# Patient Record
Sex: Female | Born: 1955
Health system: Southern US, Community
[De-identification: ages and names within clinical notes are randomized; demographics above are authoritative.]

## PROBLEM LIST (undated history)

## (undated) DIAGNOSIS — M810 Age-related osteoporosis without current pathological fracture: Secondary | ICD-10-CM

## (undated) DIAGNOSIS — G43909 Migraine, unspecified, not intractable, without status migrainosus: Secondary | ICD-10-CM

## (undated) DIAGNOSIS — K5909 Other constipation: Secondary | ICD-10-CM

## (undated) DIAGNOSIS — G43019 Migraine without aura, intractable, without status migrainosus: Secondary | ICD-10-CM

## (undated) HISTORY — PX: FRACTURE SURGERY: SHX138

## (undated) HISTORY — DX: Migraine without aura, intractable, without status migrainosus: G43.019

---

## 2010-01-15 ENCOUNTER — Emergency Department (HOSPITAL_COMMUNITY): Admission: EM | Admit: 2010-01-15 | Discharge: 2010-01-15 | Payer: Self-pay | Admitting: Emergency Medicine

## 2010-07-20 ENCOUNTER — Emergency Department (HOSPITAL_COMMUNITY)
Admission: EM | Admit: 2010-07-20 | Discharge: 2010-07-20 | Disposition: A | Discharge status: Home / Self Care | Payer: Medicare HMO | Attending: Emergency Medicine | Admitting: Emergency Medicine

## 2010-07-20 ENCOUNTER — Emergency Department (HOSPITAL_COMMUNITY): Payer: Medicare HMO

## 2010-07-20 DIAGNOSIS — S52123A Displaced fracture of head of unspecified radius, initial encounter for closed fracture: Secondary | ICD-10-CM | POA: Insufficient documentation

## 2010-07-20 DIAGNOSIS — Z7982 Long term (current) use of aspirin: Secondary | ICD-10-CM | POA: Insufficient documentation

## 2010-07-20 DIAGNOSIS — M79609 Pain in unspecified limb: Secondary | ICD-10-CM | POA: Insufficient documentation

## 2010-07-20 DIAGNOSIS — S59909A Unspecified injury of unspecified elbow, initial encounter: Secondary | ICD-10-CM | POA: Insufficient documentation

## 2010-07-20 DIAGNOSIS — S6990XA Unspecified injury of unspecified wrist, hand and finger(s), initial encounter: Secondary | ICD-10-CM | POA: Insufficient documentation

## 2010-07-20 DIAGNOSIS — IMO0002 Reserved for concepts with insufficient information to code with codable children: Secondary | ICD-10-CM | POA: Insufficient documentation

## 2010-07-20 DIAGNOSIS — Z79899 Other long term (current) drug therapy: Secondary | ICD-10-CM | POA: Insufficient documentation

## 2010-07-20 DIAGNOSIS — M25529 Pain in unspecified elbow: Secondary | ICD-10-CM | POA: Insufficient documentation

## 2010-07-20 DIAGNOSIS — I10 Essential (primary) hypertension: Secondary | ICD-10-CM | POA: Insufficient documentation

## 2010-09-07 ENCOUNTER — Other Ambulatory Visit (HOSPITAL_COMMUNITY)
Admission: RE | Admit: 2010-09-07 | Discharge: 2010-09-07 | Disposition: A | Discharge status: Home / Self Care | Payer: Medicare HMO | Source: Ambulatory Visit | Attending: Internal Medicine | Admitting: Internal Medicine

## 2010-09-07 ENCOUNTER — Other Ambulatory Visit: Payer: Self-pay

## 2010-09-07 DIAGNOSIS — Z01419 Encounter for gynecological examination (general) (routine) without abnormal findings: Secondary | ICD-10-CM | POA: Insufficient documentation

## 2010-09-10 ENCOUNTER — Other Ambulatory Visit: Payer: Self-pay | Admitting: Family Medicine

## 2010-09-10 DIAGNOSIS — Z1231 Encounter for screening mammogram for malignant neoplasm of breast: Secondary | ICD-10-CM

## 2010-09-13 ENCOUNTER — Other Ambulatory Visit: Payer: Self-pay | Admitting: Family Medicine

## 2010-09-13 DIAGNOSIS — M810 Age-related osteoporosis without current pathological fracture: Secondary | ICD-10-CM

## 2010-10-16 ENCOUNTER — Ambulatory Visit
Admission: RE | Admit: 2010-10-16 | Discharge: 2010-10-16 | Disposition: A | Discharge status: Home / Self Care | Payer: Medicare HMO | Source: Ambulatory Visit | Attending: Family Medicine | Admitting: Family Medicine

## 2010-10-16 DIAGNOSIS — Z1231 Encounter for screening mammogram for malignant neoplasm of breast: Secondary | ICD-10-CM

## 2010-10-16 DIAGNOSIS — M810 Age-related osteoporosis without current pathological fracture: Secondary | ICD-10-CM

## 2011-11-19 ENCOUNTER — Other Ambulatory Visit: Payer: Self-pay | Admitting: Family Medicine

## 2011-11-19 ENCOUNTER — Other Ambulatory Visit (HOSPITAL_COMMUNITY)
Admission: RE | Admit: 2011-11-19 | Discharge: 2011-11-19 | Disposition: A | Discharge status: Home / Self Care | Payer: 59 | Source: Ambulatory Visit | Attending: Family Medicine | Admitting: Family Medicine

## 2011-11-19 DIAGNOSIS — Z0000 Encounter for general adult medical examination without abnormal findings: Secondary | ICD-10-CM | POA: Insufficient documentation

## 2011-11-29 ENCOUNTER — Other Ambulatory Visit: Payer: Self-pay | Admitting: Family Medicine

## 2011-11-29 DIAGNOSIS — Z1231 Encounter for screening mammogram for malignant neoplasm of breast: Secondary | ICD-10-CM

## 2011-12-17 ENCOUNTER — Other Ambulatory Visit: Payer: Self-pay | Admitting: Gastroenterology

## 2011-12-26 ENCOUNTER — Ambulatory Visit
Admission: RE | Admit: 2011-12-26 | Discharge: 2011-12-26 | Disposition: A | Discharge status: Home / Self Care | Payer: 59 | Source: Ambulatory Visit | Attending: Family Medicine | Admitting: Family Medicine

## 2011-12-26 DIAGNOSIS — Z1231 Encounter for screening mammogram for malignant neoplasm of breast: Secondary | ICD-10-CM

## 2012-01-24 ENCOUNTER — Emergency Department (HOSPITAL_COMMUNITY): Payer: 59

## 2012-01-24 ENCOUNTER — Emergency Department (HOSPITAL_COMMUNITY)
Admission: EM | Admit: 2012-01-24 | Discharge: 2012-01-24 | Disposition: A | Discharge status: Home / Self Care | Payer: 59 | Attending: Emergency Medicine | Admitting: Emergency Medicine

## 2012-01-24 DIAGNOSIS — M7989 Other specified soft tissue disorders: Secondary | ICD-10-CM | POA: Insufficient documentation

## 2012-01-24 DIAGNOSIS — X58XXXA Exposure to other specified factors, initial encounter: Secondary | ICD-10-CM | POA: Insufficient documentation

## 2012-01-24 DIAGNOSIS — Y929 Unspecified place or not applicable: Secondary | ICD-10-CM | POA: Insufficient documentation

## 2012-01-24 DIAGNOSIS — S63259A Unspecified dislocation of unspecified finger, initial encounter: Secondary | ICD-10-CM

## 2012-01-24 DIAGNOSIS — Z79899 Other long term (current) drug therapy: Secondary | ICD-10-CM | POA: Insufficient documentation

## 2012-01-24 DIAGNOSIS — Y939 Activity, unspecified: Secondary | ICD-10-CM | POA: Insufficient documentation

## 2012-01-24 MED ORDER — OXYCODONE-ACETAMINOPHEN 5-325 MG PO TABS: 1.0000 | ORAL_TABLET | Freq: Four times a day (QID) | ORAL | Status: DC | PRN

## 2012-01-24 NOTE — ED Notes (Signed)
Walking dog; hand got tangled in the dogs leash. Rt. Distal middle finger. Pulses intact, swollen. Iced it last night.

## 2012-01-24 NOTE — Progress Notes (Signed)
Orthopedic Tech Progress Note Patient Details:  Jody Dixon 1955/09/10 [id number] Right 3rd digit splinted for dislocation Ortho Devices Type of Ortho Device: Finger splint Ortho Device/Splint Location: Right 3rd digit Ortho Device/Splint Interventions: Application   Asia R Thompson 01/24/2012, 12:28 PM

## 2012-01-24 NOTE — ED Provider Notes (Signed)
History   This chart was scribed for [employer]. Rubin Payor, MD, by Marcina Millard scribe. The patient was seen in room TR06C/TR06C and the patient's care was started at 1104.    CSN: [id number]  Arrival date & time 01/24/12  1055   First MD Initiated Contact with Patient 01/24/12 1104      Chief Complaint  Patient presents with  . Finger Injury    (Consider location/radiation/quality/duration/timing/severity/associated sxs/prior treatment) HPI Comments: Jody Dixon is a 56 y.o. female who presents to the Emergency Department complaining of a constant, moderate right-handed middle finger injury with associated swelling that occurred when she got her hand tangled in a dog's leash while walking him yesterday. She reports that the finger was initially bent to the side, but she popped it back into place. She reports that she iced it last night with no relief. She states that she had a previous surgery performed by Dr. Yisroel Ramming.   No past medical history on file.  No past surgical history on file.  No family history on file.  History  Substance Use Topics  . Smoking status: Not on file  . Smokeless tobacco: Not on file  . Alcohol Use: Not on file    OB History    No data available      Review of Systems  Musculoskeletal: Positive for joint swelling.       Finger injury  All other systems reviewed and are negative.    Allergies  Review of patient's allergies indicates no known allergies.  Home Medications   Current Outpatient Rx  Name  Route  Sig  Dispense  Refill  . ALOE VERA PO   Oral   Take 1 capsule by mouth daily.         Marland Kitchen GLUCOSAMINE HCL PO   Oral   Take 2 tablets by mouth daily.         . IBANDRONATE SODIUM 150 MG PO TABS   Oral   Take 150 mg by mouth every 30 (thirty) days. Take in the morning with a full glass of water, on an empty stomach, and do not take anything else by mouth or lie down for the next 30 min.         Marland Kitchen MAGNESIUM 500 MG PO  TABS   Oral   Take 1,000 mg by mouth daily.          Marland Kitchen PRESCRIPTION MEDICATION   Intramuscular   Inject into the muscle once. Dexamethasone and lidocaine trigger spot injection         . RIZATRIPTAN BENZOATE 10 MG PO TABS   Oral   Take 10 mg by mouth as needed. For Migraine  -  May repeat in 2 hours if needed         . ZONISAMIDE 100 MG PO CAPS   Oral   Take 100 mg by mouth at bedtime.          Marland Kitchen ZONISAMIDE 25 MG PO CAPS   Oral   Take 25 mg by mouth at bedtime.         . OXYCODONE-ACETAMINOPHEN 5-325 MG PO TABS   Oral   Take 1-2 tablets by mouth every 6 (six) hours as needed for pain.   10 tablet   0     BP 124/81  Pulse 73  Temp 98 F (36.7 C) (Oral)  Resp 12  Physical Exam  Nursing note and vitals reviewed. Constitutional: She is oriented to person, place, and time.  She appears well-developed and well-nourished. No distress.  HENT:  Head: Normocephalic and atraumatic.  Eyes: EOM are normal. Pupils are equal, round, and reactive to light.  Neck: Normal range of motion. Neck supple. No tracheal deviation present.  Cardiovascular: Normal rate.   Pulmonary/Chest: Effort normal. No respiratory distress.  Abdominal: Soft. She exhibits no distension.  Musculoskeletal: Normal range of motion. She exhibits no edema.       She has swelling and ecchymosis to her right middle finger over the PIP joint. She has no tenderness over the DIP join and full range of motion at the MCP.  Neurological: She is alert and oriented to person, place, and time.       Her sensation is grossly intact.  Skin: Skin is warm and dry.       Her skin intact on her right middle finger.  Psychiatric: She has a normal mood and affect. Her behavior is normal.    ED Course  Procedures (including critical care time)  11:16- Discussed planned course of treatment with the patient, including following up with a specialist,  who is agreeable at this time.    Labs Reviewed - No data to  display Dg Finger Middle Right  01/24/2012  *RADIOLOGY REPORT*  Clinical Data: Pain and swelling, injury 1 day ago  RIGHT MIDDLE FINGER 2+V  Comparison: None  Findings: Osseous demineralization. Joint spaces preserved. Mild soft tissue swelling at the PIP joint. No acute fracture, dislocation or bone destruction.  IMPRESSION: No definite acute osseous findings.   Original Report Authenticated By: Ulyses Southward, M.D.      1. Finger dislocation       MDM  Patient with a finger injury yesterday. Patient states was pointing in the wrong direction and she put it back straight. X-rays negative for fracture. It was splinted and patient will follow wit  her orthopedic surgeon  I personally performed the services described in this documentation, which was scribed in my presence. The recorded information has been reviewed and is accurate.          Juliet Rude. Rubin Payor, MD 01/24/12 1234

## 2013-01-29 IMAGING — CR DG FINGER MIDDLE 2+V*R*
3 series · 3 of 3 positions shown · non-contrast
Comparison: None

CLINICAL DATA: Pain and swelling, injury 1 day ago

RIGHT MIDDLE FINGER 2+V

[x finger pa right]
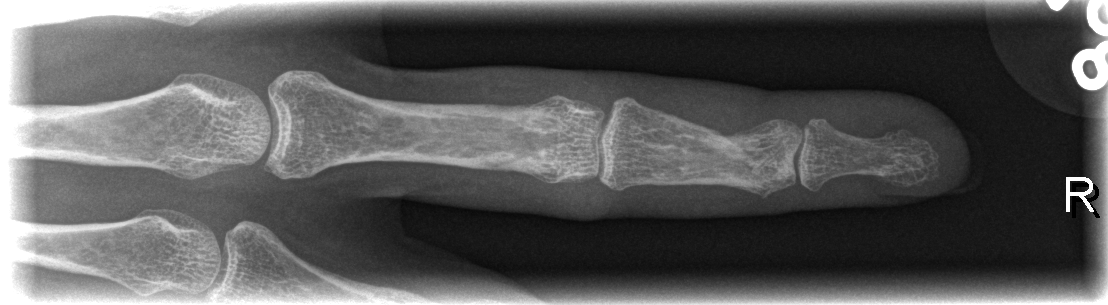

[x finger obl. right]
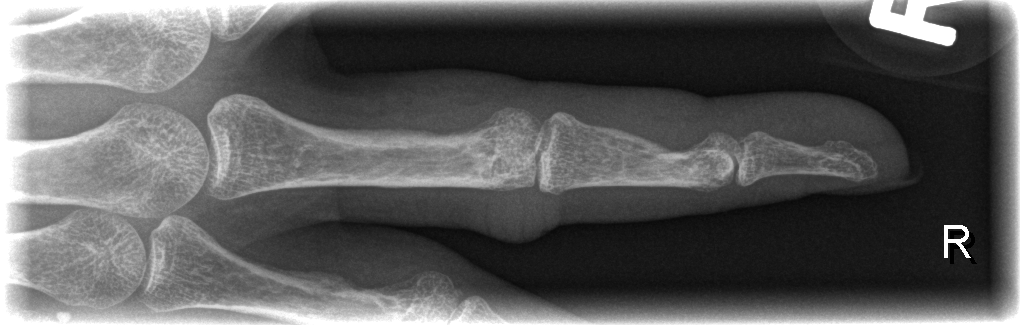

[x finger lateral right]
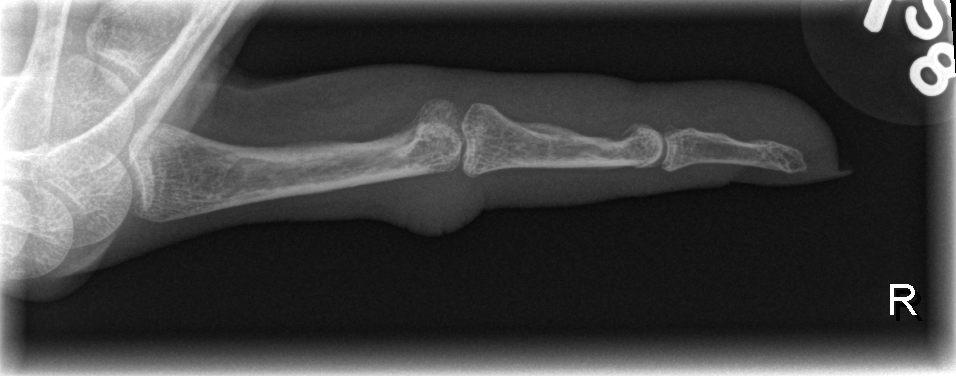

[3 of 3 positions shown; findings below may reference images not displayed]

FINDINGS: Osseous demineralization.
Joint spaces preserved.
Mild soft tissue swelling at the PIP joint.
No acute fracture, dislocation or bone destruction.
IMPRESSION: No definite acute osseous findings.

## 2013-02-08 ENCOUNTER — Other Ambulatory Visit: Payer: Self-pay

## 2013-02-08 DIAGNOSIS — Z1231 Encounter for screening mammogram for malignant neoplasm of breast: Secondary | ICD-10-CM

## 2013-03-17 ENCOUNTER — Ambulatory Visit
Admission: RE | Admit: 2013-03-17 | Discharge: 2013-03-17 | Disposition: A | Discharge status: Home / Self Care | Payer: 59 | Source: Ambulatory Visit

## 2013-03-17 DIAGNOSIS — Z1231 Encounter for screening mammogram for malignant neoplasm of breast: Secondary | ICD-10-CM

## 2013-03-19 ENCOUNTER — Ambulatory Visit
Admission: RE | Admit: 2013-03-19 | Discharge: 2013-03-19 | Disposition: A | Discharge status: Home / Self Care | Payer: 59 | Source: Ambulatory Visit | Attending: Family Medicine | Admitting: Family Medicine

## 2013-03-19 ENCOUNTER — Other Ambulatory Visit: Payer: Self-pay | Admitting: Family Medicine

## 2013-03-19 DIAGNOSIS — R0789 Other chest pain: Secondary | ICD-10-CM

## 2014-04-28 ENCOUNTER — Other Ambulatory Visit: Payer: Self-pay

## 2014-04-28 DIAGNOSIS — Z1231 Encounter for screening mammogram for malignant neoplasm of breast: Secondary | ICD-10-CM

## 2014-05-03 ENCOUNTER — Ambulatory Visit
Admission: RE | Admit: 2014-05-03 | Discharge: 2014-05-03 | Disposition: A | Discharge status: Home / Self Care | Payer: 59 | Source: Ambulatory Visit

## 2014-05-03 DIAGNOSIS — Z1231 Encounter for screening mammogram for malignant neoplasm of breast: Secondary | ICD-10-CM

## 2015-03-30 DIAGNOSIS — J111 Influenza due to unidentified influenza virus with other respiratory manifestations: Secondary | ICD-10-CM | POA: Diagnosis not present

## 2015-03-30 DIAGNOSIS — J029 Acute pharyngitis, unspecified: Secondary | ICD-10-CM | POA: Diagnosis not present

## 2015-03-30 MED FILL — OSELTAMIVIR PHOS 75 MG CAP: 75 | 5 days supply | Qty: 10 | Fill #0

## 2015-04-04 DIAGNOSIS — J111 Influenza due to unidentified influenza virus with other respiratory manifestations: Secondary | ICD-10-CM | POA: Diagnosis not present

## 2015-04-04 MED FILL — PROMETHAZINE-CODEINE SYRUP: 6.25-10 | 10 days supply | Qty: 150 | Fill #0

## 2015-04-28 MED FILL — RIZATRIPTAN 10 MG TABLET: 10 | 30 days supply | Qty: 9 | Fill #1

## 2015-04-28 MED FILL — ZONISAMIDE 50 MG CAPSULE: 50 | 30 days supply | Qty: 30 | Fill #1

## 2015-04-28 MED FILL — ZONISAMIDE 100 MG CAPSULE: 100 | 30 days supply | Qty: 60 | Fill #1

## 2015-05-05 MED FILL — IBANDRONATE NA 150 MG TAB: 150 | 90 days supply | Qty: 3 | Fill #0

## 2015-05-08 ENCOUNTER — Emergency Department (HOSPITAL_COMMUNITY)
Admission: EM | Admit: 2015-05-08 | Discharge: 2015-05-08 | Disposition: A | Discharge status: Home / Self Care | Payer: 59 | Attending: Emergency Medicine | Admitting: Emergency Medicine

## 2015-05-08 ENCOUNTER — Emergency Department (HOSPITAL_COMMUNITY): Payer: 59

## 2015-05-08 ENCOUNTER — Encounter (HOSPITAL_COMMUNITY): Payer: Self-pay | Admitting: Emergency Medicine

## 2015-05-08 DIAGNOSIS — M79672 Pain in left foot: Secondary | ICD-10-CM | POA: Insufficient documentation

## 2015-05-08 DIAGNOSIS — M79662 Pain in left lower leg: Secondary | ICD-10-CM | POA: Diagnosis not present

## 2015-05-08 HISTORY — DX: Migraine, unspecified, not intractable, without status migrainosus: G43.909

## 2015-05-08 HISTORY — DX: Other constipation: K59.09

## 2015-05-08 HISTORY — DX: Age-related osteoporosis without current pathological fracture: M81.0

## 2015-05-08 NOTE — ED Notes (Signed)
Called for Pt in lobby with no response.

## 2015-05-08 NOTE — ED Notes (Signed)
Pt c/o left upper foot pain radiating into left shin, rated 6/10, worse with walking and climbing stairs.  No known injury but noticed pain after she sat on her foot last week.

## 2015-05-08 NOTE — ED Notes (Signed)
Called for PT in lobby with no response

## 2015-05-08 NOTE — ED Notes (Signed)
Called for Pt 3rd tine in lobby with no response

## 2015-05-16 DIAGNOSIS — L570 Actinic keratosis: Secondary | ICD-10-CM | POA: Diagnosis not present

## 2015-05-16 DIAGNOSIS — D2272 Melanocytic nevi of left lower limb, including hip: Secondary | ICD-10-CM | POA: Diagnosis not present

## 2015-05-16 DIAGNOSIS — D2371 Other benign neoplasm of skin of right lower limb, including hip: Secondary | ICD-10-CM | POA: Diagnosis not present

## 2015-05-16 MED FILL — FLUOROURACIL 5% CREAM: 5 | 20 days supply | Qty: 40 | Fill #0

## 2015-06-12 ENCOUNTER — Other Ambulatory Visit: Payer: Self-pay | Admitting: Family Medicine

## 2015-06-12 ENCOUNTER — Other Ambulatory Visit (HOSPITAL_COMMUNITY)
Admission: RE | Admit: 2015-06-12 | Discharge: 2015-06-12 | Disposition: A | Discharge status: Home / Self Care | Payer: 59 | Source: Ambulatory Visit | Attending: Family Medicine | Admitting: Family Medicine

## 2015-06-12 DIAGNOSIS — Z0000 Encounter for general adult medical examination without abnormal findings: Secondary | ICD-10-CM | POA: Diagnosis not present

## 2015-06-12 DIAGNOSIS — Z1151 Encounter for screening for human papillomavirus (HPV): Secondary | ICD-10-CM | POA: Diagnosis not present

## 2015-06-12 DIAGNOSIS — E782 Mixed hyperlipidemia: Secondary | ICD-10-CM | POA: Diagnosis not present

## 2015-06-12 DIAGNOSIS — E559 Vitamin D deficiency, unspecified: Secondary | ICD-10-CM | POA: Diagnosis not present

## 2015-06-12 DIAGNOSIS — Z01411 Encounter for gynecological examination (general) (routine) with abnormal findings: Secondary | ICD-10-CM | POA: Insufficient documentation

## 2015-06-12 DIAGNOSIS — R5383 Other fatigue: Secondary | ICD-10-CM | POA: Diagnosis not present

## 2015-06-12 DIAGNOSIS — Z124 Encounter for screening for malignant neoplasm of cervix: Secondary | ICD-10-CM | POA: Diagnosis not present

## 2015-06-15 LAB — CYTOLOGY - PAP

## 2015-06-20 MED FILL — ZONISAMIDE 50 MG CAPSULE: 50 | 30 days supply | Qty: 30 | Fill #2

## 2015-06-20 MED FILL — ZONISAMIDE 100 MG CAPSULE: 100 | 30 days supply | Qty: 60 | Fill #2

## 2015-06-22 ENCOUNTER — Other Ambulatory Visit: Payer: Self-pay

## 2015-06-22 DIAGNOSIS — Z1231 Encounter for screening mammogram for malignant neoplasm of breast: Secondary | ICD-10-CM

## 2015-07-03 ENCOUNTER — Ambulatory Visit
Admission: RE | Admit: 2015-07-03 | Discharge: 2015-07-03 | Disposition: A | Discharge status: Home / Self Care | Payer: 59 | Source: Ambulatory Visit

## 2015-07-03 DIAGNOSIS — Z1231 Encounter for screening mammogram for malignant neoplasm of breast: Secondary | ICD-10-CM

## 2015-07-10 DIAGNOSIS — H524 Presbyopia: Secondary | ICD-10-CM | POA: Diagnosis not present

## 2015-07-10 DIAGNOSIS — H52223 Regular astigmatism, bilateral: Secondary | ICD-10-CM | POA: Diagnosis not present

## 2015-07-10 DIAGNOSIS — H5203 Hypermetropia, bilateral: Secondary | ICD-10-CM | POA: Diagnosis not present

## 2015-07-11 DIAGNOSIS — G43719 Chronic migraine without aura, intractable, without status migrainosus: Secondary | ICD-10-CM | POA: Diagnosis not present

## 2015-07-11 DIAGNOSIS — R0789 Other chest pain: Secondary | ICD-10-CM | POA: Diagnosis not present

## 2015-07-11 DIAGNOSIS — M940 Chondrocostal junction syndrome [Tietze]: Secondary | ICD-10-CM | POA: Diagnosis not present

## 2015-07-11 DIAGNOSIS — G43019 Migraine without aura, intractable, without status migrainosus: Secondary | ICD-10-CM | POA: Diagnosis not present

## 2015-07-11 MED FILL — CYCLOBENZAPRINE 10 MG TAB: 10 | 49 days supply | Qty: 30 | Fill #0

## 2015-07-11 MED FILL — RIZATRIPTAN 10 MG TABLET: 10 | 30 days supply | Qty: 9 | Fill #0

## 2015-07-12 DIAGNOSIS — E2839 Other primary ovarian failure: Secondary | ICD-10-CM | POA: Diagnosis not present

## 2015-07-12 DIAGNOSIS — M810 Age-related osteoporosis without current pathological fracture: Secondary | ICD-10-CM | POA: Diagnosis not present

## 2015-07-12 MED FILL — MELOXICAM 15 MG TABLET: 15 | 15 days supply | Qty: 15 | Fill #0

## 2015-07-25 DIAGNOSIS — Z411 Encounter for cosmetic surgery: Secondary | ICD-10-CM | POA: Diagnosis not present

## 2015-07-25 DIAGNOSIS — L570 Actinic keratosis: Secondary | ICD-10-CM | POA: Diagnosis not present

## 2015-08-30 DIAGNOSIS — M810 Age-related osteoporosis without current pathological fracture: Secondary | ICD-10-CM | POA: Diagnosis not present

## 2015-08-30 DIAGNOSIS — M2550 Pain in unspecified joint: Secondary | ICD-10-CM | POA: Diagnosis not present

## 2015-08-30 DIAGNOSIS — M79642 Pain in left hand: Secondary | ICD-10-CM | POA: Diagnosis not present

## 2015-08-30 DIAGNOSIS — M79641 Pain in right hand: Secondary | ICD-10-CM | POA: Diagnosis not present

## 2015-08-30 DIAGNOSIS — R5383 Other fatigue: Secondary | ICD-10-CM | POA: Diagnosis not present

## 2015-08-30 MED FILL — NAPROXEN 500 MG TABLET: 500 | 30 days supply | Qty: 60 | Fill #0

## 2015-10-12 DIAGNOSIS — R768 Other specified abnormal immunological findings in serum: Secondary | ICD-10-CM | POA: Diagnosis not present

## 2015-10-12 DIAGNOSIS — M810 Age-related osteoporosis without current pathological fracture: Secondary | ICD-10-CM | POA: Diagnosis not present

## 2015-10-12 DIAGNOSIS — M2550 Pain in unspecified joint: Secondary | ICD-10-CM | POA: Diagnosis not present

## 2015-10-16 DIAGNOSIS — H3562 Retinal hemorrhage, left eye: Secondary | ICD-10-CM | POA: Diagnosis not present

## 2015-10-18 MED FILL — NAPROXEN 500 MG TABLET: 500 | 30 days supply | Qty: 60 | Fill #1

## 2015-10-26 DIAGNOSIS — M810 Age-related osteoporosis without current pathological fracture: Secondary | ICD-10-CM | POA: Diagnosis not present

## 2015-11-29 MED FILL — RIZATRIPTAN 10 MG TABLET: 10 | 30 days supply | Qty: 9 | Fill #1

## 2015-11-29 MED FILL — CYCLOBENZAPRINE 10 MG TAB: 10 | 49 days supply | Qty: 30 | Fill #1

## 2016-01-15 DIAGNOSIS — R768 Other specified abnormal immunological findings in serum: Secondary | ICD-10-CM | POA: Diagnosis not present

## 2016-01-15 DIAGNOSIS — M810 Age-related osteoporosis without current pathological fracture: Secondary | ICD-10-CM | POA: Diagnosis not present

## 2016-01-15 DIAGNOSIS — M2550 Pain in unspecified joint: Secondary | ICD-10-CM | POA: Diagnosis not present

## 2016-01-15 MED FILL — NAPROXEN 500 MG TABLET: 500 | 90 days supply | Qty: 180 | Fill #0

## 2016-04-29 DIAGNOSIS — M810 Age-related osteoporosis without current pathological fracture: Secondary | ICD-10-CM | POA: Diagnosis not present

## 2016-05-07 MED FILL — NAPROXEN 500 MG TABLET: 500 | 90 days supply | Qty: 180 | Fill #1

## 2016-05-21 DIAGNOSIS — D2272 Melanocytic nevi of left lower limb, including hip: Secondary | ICD-10-CM | POA: Diagnosis not present

## 2016-05-21 DIAGNOSIS — D2371 Other benign neoplasm of skin of right lower limb, including hip: Secondary | ICD-10-CM | POA: Diagnosis not present

## 2016-05-21 DIAGNOSIS — D225 Melanocytic nevi of trunk: Secondary | ICD-10-CM | POA: Diagnosis not present

## 2016-05-21 DIAGNOSIS — Z23 Encounter for immunization: Secondary | ICD-10-CM | POA: Diagnosis not present

## 2016-07-15 DIAGNOSIS — Z6824 Body mass index (BMI) 24.0-24.9, adult: Secondary | ICD-10-CM | POA: Diagnosis not present

## 2016-07-15 DIAGNOSIS — M2550 Pain in unspecified joint: Secondary | ICD-10-CM | POA: Diagnosis not present

## 2016-07-15 DIAGNOSIS — M810 Age-related osteoporosis without current pathological fracture: Secondary | ICD-10-CM | POA: Diagnosis not present

## 2016-07-15 DIAGNOSIS — R768 Other specified abnormal immunological findings in serum: Secondary | ICD-10-CM | POA: Diagnosis not present

## 2016-07-15 DIAGNOSIS — Z79899 Other long term (current) drug therapy: Secondary | ICD-10-CM | POA: Diagnosis not present

## 2016-07-15 MED FILL — DICLOFENAC SOD 75 MG TAB EC: 75 | 30 days supply | Qty: 60 | Fill #0

## 2016-09-02 MED FILL — DICLOFENAC SOD EC 75 MG TAB: 75 | 30 days supply | Qty: 60 | Fill #1

## 2016-09-24 DIAGNOSIS — R768 Other specified abnormal immunological findings in serum: Secondary | ICD-10-CM | POA: Diagnosis not present

## 2016-09-24 DIAGNOSIS — M150 Primary generalized (osteo)arthritis: Secondary | ICD-10-CM | POA: Diagnosis not present

## 2016-09-24 DIAGNOSIS — M810 Age-related osteoporosis without current pathological fracture: Secondary | ICD-10-CM | POA: Diagnosis not present

## 2016-09-24 DIAGNOSIS — M2550 Pain in unspecified joint: Secondary | ICD-10-CM | POA: Diagnosis not present

## 2016-09-24 DIAGNOSIS — M064 Inflammatory polyarthropathy: Secondary | ICD-10-CM | POA: Diagnosis not present

## 2016-09-24 DIAGNOSIS — Z6823 Body mass index (BMI) 23.0-23.9, adult: Secondary | ICD-10-CM | POA: Diagnosis not present

## 2016-09-24 DIAGNOSIS — Z79899 Other long term (current) drug therapy: Secondary | ICD-10-CM | POA: Diagnosis not present

## 2016-09-24 MED FILL — DICLOFENAC SODIUM 1% GEL: 1 | 30 days supply | Qty: 100 | Fill #0

## 2016-09-24 MED FILL — HYDROXYCHLOROQUINE 200 MG T: 200 | 30 days supply | Qty: 60 | Fill #0

## 2016-10-21 ENCOUNTER — Ambulatory Visit (INDEPENDENT_AMBULATORY_CARE_PROVIDER_SITE_OTHER): Payer: 59 | Admitting: Neurology

## 2016-10-21 ENCOUNTER — Encounter: Payer: Self-pay | Admitting: Neurology

## 2016-10-21 DIAGNOSIS — G43019 Migraine without aura, intractable, without status migrainosus: Secondary | ICD-10-CM | POA: Diagnosis not present

## 2016-10-21 HISTORY — DX: Migraine without aura, intractable, without status migrainosus: G43.019

## 2016-10-21 MED ORDER — ZOLMITRIPTAN 5 MG NA SOLN: 1.0000 | Freq: Two times a day (BID) | NASAL | 3 refills | Status: DC | PRN

## 2016-10-21 MED ORDER — TOPIRAMATE 25 MG PO TABS: ORAL_TABLET | ORAL | 1 refills | Status: DC

## 2016-10-21 MED FILL — TOPIRAMATE 25 MG TAB: 25 | 30 days supply | Qty: 90 | Fill #0

## 2016-10-21 NOTE — Patient Instructions (Signed)
   We will get back on Topamax and try zomig nasal spray when needed for the headache.  Topamax (topiramate) is a seizure medication that has an FDA approval for seizures and for migraine headache. Potential side effects of this medication include weight loss, cognitive slowing, tingling in the fingers and toes, and carbonated drinks will taste bad. If any significant side effects are noted on this drug, please contact our office.

## 2016-10-21 NOTE — Progress Notes (Signed)
Reason for visit: Migraine headache  Referring physician: Dr. Cheri Fowler AVAEH EWER is a 61 y.o. female  History of present illness:  Jody Dixon is a 61 year old left-handed white female with a history of migraine headaches dating back to her high school years. The patient has noted gradual worsening of her headache frequency and severity over the years. She is now having about 8-10 headache days a month, the headaches will last several hours. Headaches are usually in the right frontotemporal area and may spread to be bifrontal in nature associated with some nausea and vomiting, and some occasional cognitive clouding. The patient reports no visual disturbances with her headache. She indicates that bright lights, certain odors, and possibly weather changes may bring on her headache. In the past, she has been on Topamax with some benefit, she was switched to [place] but this did not help her much as the Topamax did. The patient has been on Lyrica previously for left foot pain but this did not help her headache. The patient had swelling on the medication. The patient is currently not on a daily prophylactic drug. She is taking Maxalt with some benefit but she finds that she cannot take it at work as it makes her slightly drowsy. She may have trouble taking oral medications because of nausea with her headache. She denies any numbness or weakness of extremities, she denies any balance issues. Her sister also had migraine. She is under some stress as she is caring for her mother who has cancer. She may take Tylenol Extra Strength, 2 tablets each day. She does not drink a lot of caffeinated products during the day, she drinks some tea. She is sent to this office for an evaluation. She was previously followed through the [facility], she indicates that trigger point injections previously were of some benefit.  Past Medical History:  Diagnosis Date  . Chronic constipation   . Migraine   .  Osteoporosis     Past Surgical History:  Procedure Laterality Date  . FRACTURE SURGERY Right    foot, crushing injury    No family history on file.  Social history:  reports that she has never smoked. She has never used smokeless tobacco. She reports that she does not drink alcohol or use drugs.  Medications:  Prior to Admission medications   Medication Sig Start Date End Date Taking? Authorizing Provider  ALOE VERA PO Take 1 capsule by mouth daily.   Yes [provider]  cyclobenzaprine (FLEXERIL) 10 MG tablet Take 10 mg by mouth 3 (three) times daily as needed for muscle spasms.   Yes [provider]  denosumab (PROLIA) 60 MG/ML SOLN injection Inject 60 mg into the skin every 6 (six) months. Administer in upper arm, thigh, or abdomen   Yes [provider]  diclofenac (VOLTAREN) 75 MG EC tablet Take 75 mg by mouth.  09/02/16  Yes [provider]  diclofenac sodium (VOLTAREN) 1 % GEL  09/24/16  Yes [provider]  hydroxychloroquine (PLAQUENIL) 200 MG tablet Take 200 mg by mouth 2 (two) times daily.  09/24/16  Yes [provider]  PRESCRIPTION MEDICATION Inject into the muscle once. Dexamethasone and lidocaine trigger spot injection   Yes [provider]  rizatriptan (MAXALT) 10 MG tablet Take 10 mg by mouth as needed. For Migraine  -  May repeat in 2 hours if needed   Yes [provider]     No Known Allergies  ROS:  Out of a complete 14 system review of symptoms, the patient complains only of the following symptoms, and all other reviewed systems are negative.  Migraine headache  Blood pressure (!) 165/97, pulse 97, height 5\' 7"  (1.702 m), weight 153 lb (69.4 kg).  Physical Exam  General: The patient is alert and cooperative at the time of the examination.  Eyes: Pupils are equal, round, and reactive to light. Discs are flat bilaterally.  Neck: The neck is supple, no carotid bruits are  noted.  Respiratory: The respiratory examination is clear.  Cardiovascular: The cardiovascular examination reveals a regular rate and rhythm, no obvious murmurs or rubs are noted.  Neuromuscular: Range of movement the cervical spine is full. No crepitus is noted in the temporomandibular joints.  Skin: Extremities are without significant edema.  Neurologic Exam  Mental status: The patient is alert and oriented x 3 at the time of the examination. The patient has apparent normal recent and remote memory, with an apparently normal attention span and concentration ability.  Cranial nerves: Facial symmetry is present. There is good sensation of the face to pinprick and soft touch bilaterally. The strength of the facial muscles and the muscles to head turning and shoulder shrug are normal bilaterally. Speech is well enunciated, no aphasia or dysarthria is noted. Extraocular movements are full. Visual fields are full. The tongue is midline, and the patient has symmetric elevation of the soft palate. No obvious hearing deficits are noted.  Motor: The motor testing reveals 5 over 5 strength of all 4 extremities. Good symmetric motor tone is noted throughout.  Sensory: Sensory testing is intact to pinprick, soft touch, vibration sensation, and position sense on all 4 extremities. No evidence of extinction is noted.  Coordination: Cerebellar testing reveals good finger-nose-finger and heel-to-shin bilaterally.  Gait and station: Gait is normal. Tandem gait is normal. Romberg is negative. No drift is seen.  Reflexes: Deep tendon reflexes are symmetric and normal bilaterally. Toes are downgoing bilaterally.   Assessment/Plan:  1. Common migraine headache  The patient will be placed back on Topamax as this was helpful previously, we will workup to 75 mg at night, she will call for a dose adjustments. The patient was given Zomig nasal spray to take as she has a lot of nausea with her headache. If this  is not effective, we will switch back to Maxalt. She will follow-up in this office in about 3 months.  Jill Alexanders MD 10/21/2016 11:28 AM  [facility] [address] [place], [place] [address]  Phone [phone] Fax [phone]

## 2016-10-22 ENCOUNTER — Telehealth: Payer: Self-pay | Admitting: *Deleted

## 2016-10-22 MED FILL — DICLOFENAC SODIUM 75 MG TAB: 75 | 30 days supply | Qty: 60 | Fill #2

## 2016-10-22 NOTE — Telephone Encounter (Signed)
Submitted PA zomig nasal spray to [employer] on covermymeds. [id number]. Waiting on response from insurance.

## 2016-10-24 MED FILL — ZOMIG 5 MG NASAL SPRAY: 5 | 30 days supply | Qty: 6 | Fill #0

## 2016-10-24 NOTE — Telephone Encounter (Signed)
Called to check on status of PA. Spoke with Jody Dixon with pharmacy help desk ([employer]). He stated they did receive PA. Nothing has been done for a couple days. He is going to send an email to have PA expedited since he sees nothing has been done. They will fax determination to our office. I verified fax with him, [phone].

## 2016-10-28 DIAGNOSIS — M810 Age-related osteoporosis without current pathological fracture: Secondary | ICD-10-CM | POA: Diagnosis not present

## 2016-10-28 NOTE — Telephone Encounter (Signed)
Received fax notification from [employer] that PA zomig approved from 10/24/16-10/23/17 for max of 12 refills.

## 2016-10-30 MED FILL — HYDROXYCHLOROQUINE 200 MG T: 200 | 30 days supply | Qty: 60 | Fill #1

## 2016-11-25 ENCOUNTER — Telehealth: Payer: Self-pay | Admitting: Neurology

## 2016-11-25 MED ORDER — TOPIRAMATE 100 MG PO TABS: 100.0000 mg | ORAL_TABLET | Freq: Every day | ORAL | 1 refills | Status: DC

## 2016-11-25 MED FILL — DICLOFENAC SODIUM 75 MG TAB: 75 | 30 days supply | Qty: 60 | Fill #3

## 2016-11-25 MED FILL — TOPIRAMATE 100 MG TABLET: 100 | 90 days supply | Qty: 90 | Fill #0

## 2016-11-25 NOTE — Addendum Note (Signed)
Addended by: Kathrynn Ducking on: 11/25/2016 03:52 PM   Modules accepted: Orders

## 2016-11-25 NOTE — Telephone Encounter (Signed)
I called patient. She is doing well on 75 mg of the Topamax, we will switch to 100 mg at night.

## 2016-11-25 NOTE — Telephone Encounter (Signed)
Patient called office in reference to topiramate (TOPAMAX) 25 MG tablet.  Patient is needing a refill, but would like to know if the medication is going to be increased to 100mg .  Patient has been tolerating the medication just fine.  Pharmacy- [facility].  Please call

## 2016-12-10 MED FILL — HYDROXYCHLOROQUINE 200 MG T: 200 | 30 days supply | Qty: 60 | Fill #2

## 2016-12-30 DIAGNOSIS — M2550 Pain in unspecified joint: Secondary | ICD-10-CM | POA: Diagnosis not present

## 2016-12-30 DIAGNOSIS — R768 Other specified abnormal immunological findings in serum: Secondary | ICD-10-CM | POA: Diagnosis not present

## 2016-12-30 DIAGNOSIS — Z79899 Other long term (current) drug therapy: Secondary | ICD-10-CM | POA: Diagnosis not present

## 2016-12-30 DIAGNOSIS — Z6823 Body mass index (BMI) 23.0-23.9, adult: Secondary | ICD-10-CM | POA: Diagnosis not present

## 2016-12-30 DIAGNOSIS — M150 Primary generalized (osteo)arthritis: Secondary | ICD-10-CM | POA: Diagnosis not present

## 2016-12-30 DIAGNOSIS — M810 Age-related osteoporosis without current pathological fracture: Secondary | ICD-10-CM | POA: Diagnosis not present

## 2017-01-14 MED FILL — DICLOFENAC SODIUM 75 MG TAB: 75 | 30 days supply | Qty: 60 | Fill #0

## 2017-01-29 ENCOUNTER — Encounter: Payer: Self-pay | Admitting: Adult Health

## 2017-01-29 ENCOUNTER — Ambulatory Visit: Payer: 59 | Admitting: Adult Health

## 2017-01-29 VITALS — BP 158/88 | HR 79 | Ht 67.0 in | Wt 155.0 lb

## 2017-01-29 DIAGNOSIS — G43009 Migraine without aura, not intractable, without status migrainosus: Secondary | ICD-10-CM

## 2017-01-29 MED ORDER — CYCLOBENZAPRINE HCL 10 MG PO TABS: 10.0000 mg | ORAL_TABLET | Freq: Every day | ORAL | 0 refills | Status: DC | PRN

## 2017-01-29 MED ORDER — RIZATRIPTAN BENZOATE 10 MG PO TABS: ORAL_TABLET | ORAL | 11 refills | Status: DC

## 2017-01-29 MED FILL — CYCLOBENZAPRINE 10 MG TAB: 10 | 30 days supply | Qty: 30 | Fill #0

## 2017-01-29 MED FILL — HYDROXYCHLOROQUINE 200 MG T: 200 | 30 days supply | Qty: 60 | Fill #3

## 2017-01-29 MED FILL — RIZATRIPTAN 10 MG TABLET: 10 | 30 days supply | Qty: 10 | Fill #0

## 2017-01-29 NOTE — Progress Notes (Signed)
PATIENT: Jody Dixon DOB: [birth year]  REASON FOR VISIT: follow up HISTORY FROM: patient  HISTORY OF PRESENT ILLNESS: Today 01/29/17 Jody Dixon is a 61 year old female with a history of migraine headaches.  She returns today for follow-up.  She is currently on Topamax 100 mg daily as well as Zomig nasal spray.  She reports that she has 3-4 migraines a month.  Her headaches typically occur across the forehead and sometimes they radiate down into the shoulders.  She states that on occasion she does have photophobia, phonophobia, nausea and vomiting.  She also reports a sensitivity to smell.  She denies any changes with her vision.  She reports on occasion she will use Flexeril when the headache moves to her shoulders.  She reports that Zomig was beneficial for the headache although the taste was unpleasant and the medication is expensive.  She would like to switch back to Maxalt.  She returns today for an evaluation.  HISTORY Jody Dixon is a 61 year old left-handed white female with a history of migraine headaches dating back to her high school years. The patient has noted gradual worsening of her headache frequency and severity over the years. She is now having about 8-10 headache days a month, the headaches will last several hours. Headaches are usually in the right frontotemporal area and may spread to be bifrontal in nature associated with some nausea and vomiting, and some occasional cognitive clouding. The patient reports no visual disturbances with her headache. She indicates that bright lights, certain odors, and possibly weather changes may bring on her headache. In the past, she has been on Topamax with some benefit, she was switched to [place] but this did not help her much as the Topamax did. The patient has been on Lyrica previously for left foot pain but this did not help her headache. The patient had swelling on the medication. The patient is currently not on a daily prophylactic drug.  She is taking Maxalt with some benefit but she finds that she cannot take it at work as it makes her slightly drowsy. She may have trouble taking oral medications because of nausea with her headache. She denies any numbness or weakness of extremities, she denies any balance issues. Her sister also had migraine. She is under some stress as she is caring for her mother who has cancer. She may take Tylenol Extra Strength, 2 tablets each day. She does not drink a lot of caffeinated products during the day, she drinks some tea. She is sent to this office for an evaluation. She was previously followed through the [facility], she indicates that trigger point injections previously were of some benefit.  REVIEW OF SYSTEMS: Out of a complete 14 system review of symptoms, the patient complains only of the following symptoms, and all other reviewed systems are negative.  See HPI  ALLERGIES: No Known Allergies  HOME MEDICATIONS: Outpatient Medications Prior to Visit  Medication Sig Dispense Refill  . ALOE VERA PO Take 1 capsule by mouth daily.    . cyclobenzaprine (FLEXERIL) 10 MG tablet Take 10 mg by mouth 3 (three) times daily as needed for muscle spasms.    Marland Kitchen denosumab (PROLIA) 60 MG/ML SOLN injection Inject 60 mg into the skin every 6 (six) months. Administer in upper arm, thigh, or abdomen    . diclofenac (VOLTAREN) 75 MG EC tablet Take 75 mg by mouth.   3  . diclofenac sodium (VOLTAREN) 1 % GEL   6  . hydroxychloroquine (  PLAQUENIL) 200 MG tablet Take 200 mg by mouth 2 (two) times daily.   3  . PRESCRIPTION MEDICATION Inject into the muscle once. Dexamethasone and lidocaine trigger spot injection    . topiramate (TOPAMAX) 100 MG tablet Take 1 tablet (100 mg total) by mouth at bedtime. 90 tablet 1  . zolmitriptan (ZOMIG) 5 MG nasal solution Place 1 spray into the nose 2 (two) times daily as needed for migraine. 6 Units 3   No facility-administered medications prior to visit.     PAST  MEDICAL HISTORY: Past Medical History:  Diagnosis Date  . Chronic constipation   . Common migraine with intractable migraine 10/21/2016  . Migraine   . Osteoporosis     PAST SURGICAL HISTORY: Past Surgical History:  Procedure Laterality Date  . FRACTURE SURGERY Right    foot, crushing injury    FAMILY HISTORY: No family history on file.  SOCIAL HISTORY: Social History   Socioeconomic History  . Marital status: Divorced    Spouse name: Not on file  . Number of children: Not on file  . Years of education: Not on file  . Highest education level: Not on file  Social Needs  . Financial resource strain: Not on file  . Food insecurity - worry: Not on file  . Food insecurity - inability: Not on file  . Transportation needs - medical: Not on file  . Transportation needs - non-medical: Not on file  Occupational History  . Not on file  Tobacco Use  . Smoking status: Never Smoker  . Smokeless tobacco: Never Used  Substance and Sexual Activity  . Alcohol use: No  . Drug use: No  . Sexual activity: No  Other Topics Concern  . Not on file  Social History Narrative  . Not on file      PHYSICAL EXAM  Vitals:   01/29/17 1044  BP: (!) 158/88  Pulse: 79  Weight: 155 lb (70.3 kg)  Height: 5\' 7"  (1.702 m)   Body mass index is 24.28 kg/m.  Generalized: Well developed, in no acute distress   Neurological examination  Mentation: Alert oriented to time, place, history taking. Follows all commands speech and language fluent Cranial nerve II-XII: Pupils were equal round reactive to light. Extraocular movements were full, visual field were full on confrontational test. Facial sensation and strength were normal. Uvula tongue midline. Head turning and shoulder shrug  were normal and symmetric. Motor: The motor testing reveals 5 over 5 strength of all 4 extremities. Good symmetric motor tone is noted throughout.  Sensory: Sensory testing is intact to soft touch on all 4  extremities. No evidence of extinction is noted.  Coordination: Cerebellar testing reveals good finger-nose-finger and heel-to-shin bilaterally.  Gait and station: Gait is normal. Tandem gait is normal. Romberg is negative. No drift is seen.  Reflexes: Deep tendon reflexes are symmetric and normal bilaterally.   DIAGNOSTIC DATA (LABS, IMAGING, TESTING) - I reviewed patient records, labs, notes, testing and imaging myself where available.     ASSESSMENT AND PLAN 61 y.o. year old female  has a past medical history of Chronic constipation, Common migraine with intractable migraine (10/21/2016), Migraine, and Osteoporosis. here with:  1.  Migraine headaches  Overall the patient is doing well.  She will continue on Topamax 100 mg daily.  We will switch Zomig to Maxalt.  Will continue to use Flexeril as needed.  She does mention some issues with depression.  She has not seen her primary care provider  in a while according to the patient.  She was advised to make a follow-up appointment with her primary care to discuss these symptoms.  She voiced understanding.  She will follow-up in 6 months or sooner if needed.    Ward Givens, MSN, NP-C 01/29/2017, 10:51 AM [facility] [address], [place], [place] [address] [phone]

## 2017-01-29 NOTE — Progress Notes (Signed)
I have read the note, and I agree with the clinical assessment and plan.  Kayden Hutmacher K Macrae Wiegman   

## 2017-01-29 NOTE — Patient Instructions (Signed)
Your Plan:  Continue Using Topamax 100 mg daily  Continue Flexeril as needed.  Maxalt ordered If your symptoms worsen or you develop new symptoms please let us know.       Thank you for coming to see [place] at [facility]. I hope we have been able to provide you high quality care today.  You may receive a patient satisfaction survey over the next few weeks. We would appreciate your feedback and comments so that we may continue to improve ourselves and the health of our patients.

## 2017-02-20 MED FILL — DICLOFENAC SODIUM 75 MG TAB: 75 | 30 days supply | Qty: 60 | Fill #1

## 2017-02-20 MED FILL — TOPIRAMATE 100 MG TABLET: 100 | 90 days supply | Qty: 90 | Fill #1

## 2017-03-12 MED FILL — HYDROXYCHLOROQUINE 200 MG T: 200 | 30 days supply | Qty: 60 | Fill #0

## 2017-03-24 DIAGNOSIS — R768 Other specified abnormal immunological findings in serum: Secondary | ICD-10-CM | POA: Diagnosis not present

## 2017-03-24 DIAGNOSIS — M150 Primary generalized (osteo)arthritis: Secondary | ICD-10-CM | POA: Diagnosis not present

## 2017-03-24 DIAGNOSIS — Z6824 Body mass index (BMI) 24.0-24.9, adult: Secondary | ICD-10-CM | POA: Diagnosis not present

## 2017-03-24 DIAGNOSIS — Z79899 Other long term (current) drug therapy: Secondary | ICD-10-CM | POA: Diagnosis not present

## 2017-03-24 DIAGNOSIS — M064 Inflammatory polyarthropathy: Secondary | ICD-10-CM | POA: Diagnosis not present

## 2017-03-24 DIAGNOSIS — M7062 Trochanteric bursitis, left hip: Secondary | ICD-10-CM | POA: Diagnosis not present

## 2017-03-24 DIAGNOSIS — M2550 Pain in unspecified joint: Secondary | ICD-10-CM | POA: Diagnosis not present

## 2017-03-24 DIAGNOSIS — M810 Age-related osteoporosis without current pathological fracture: Secondary | ICD-10-CM | POA: Diagnosis not present

## 2017-03-24 DIAGNOSIS — M545 Low back pain: Secondary | ICD-10-CM | POA: Diagnosis not present

## 2017-03-24 MED FILL — predniSONE 10 MG (21) TBPK: 10 | 6 days supply | Qty: 21 | Fill #0

## 2017-04-03 DIAGNOSIS — H04123 Dry eye syndrome of bilateral lacrimal glands: Secondary | ICD-10-CM | POA: Diagnosis not present

## 2017-04-03 DIAGNOSIS — H43811 Vitreous degeneration, right eye: Secondary | ICD-10-CM | POA: Diagnosis not present

## 2017-04-03 DIAGNOSIS — H2513 Age-related nuclear cataract, bilateral: Secondary | ICD-10-CM | POA: Diagnosis not present

## 2017-04-10 MED FILL — RIZATRIPTAN BENZOATE 10 MG: 10 | 30 days supply | Qty: 10 | Fill #1

## 2017-04-10 MED FILL — DICLOFENAC SODIUM 75 MG TAB: 75 | 30 days supply | Qty: 60 | Fill #2

## 2017-04-24 ENCOUNTER — Other Ambulatory Visit: Payer: Self-pay | Admitting: Adult Health

## 2017-04-24 MED FILL — CYCLOBENZAPRINE 10 MG TAB: 10 | 30 days supply | Qty: 30 | Fill #0

## 2017-04-24 MED FILL — HYDROXYCHLOROQUINE 200 MG T: 200 | 30 days supply | Qty: 60 | Fill #1

## 2017-05-01 DIAGNOSIS — M810 Age-related osteoporosis without current pathological fracture: Secondary | ICD-10-CM | POA: Diagnosis not present

## 2017-05-08 DIAGNOSIS — M545 Low back pain: Secondary | ICD-10-CM | POA: Diagnosis not present

## 2017-05-13 MED FILL — DICLOFENAC SOD EC 75 MG TAB: 75 | 30 days supply | Qty: 60 | Fill #0

## 2017-05-22 DIAGNOSIS — M5136 Other intervertebral disc degeneration, lumbar region: Secondary | ICD-10-CM | POA: Diagnosis not present

## 2017-06-03 DIAGNOSIS — Z23 Encounter for immunization: Secondary | ICD-10-CM | POA: Diagnosis not present

## 2017-06-03 DIAGNOSIS — D225 Melanocytic nevi of trunk: Secondary | ICD-10-CM | POA: Diagnosis not present

## 2017-06-03 DIAGNOSIS — L821 Other seborrheic keratosis: Secondary | ICD-10-CM | POA: Diagnosis not present

## 2017-06-03 DIAGNOSIS — D2272 Melanocytic nevi of left lower limb, including hip: Secondary | ICD-10-CM | POA: Diagnosis not present

## 2017-06-03 DIAGNOSIS — D2362 Other benign neoplasm of skin of left upper limb, including shoulder: Secondary | ICD-10-CM | POA: Diagnosis not present

## 2017-06-03 DIAGNOSIS — D2371 Other benign neoplasm of skin of right lower limb, including hip: Secondary | ICD-10-CM | POA: Diagnosis not present

## 2017-06-03 MED FILL — LIDOCAINE PATCH 5%: 5 | 30 days supply | Qty: 30 | Fill #0

## 2017-06-04 ENCOUNTER — Other Ambulatory Visit: Payer: Self-pay | Admitting: Neurology

## 2017-06-04 MED FILL — TOPIRAMATE 100 MG TABS: 100 | 90 days supply | Qty: 90 | Fill #0

## 2017-06-04 MED FILL — RIZATRIPTAN BENZOATE 10 MG: 10 | 30 days supply | Qty: 10 | Fill #2

## 2017-06-10 ENCOUNTER — Encounter: Payer: Self-pay | Admitting: Physical Therapy

## 2017-06-10 ENCOUNTER — Ambulatory Visit: Payer: 59 | Attending: Orthopedic Surgery | Admitting: Physical Therapy

## 2017-06-10 ENCOUNTER — Other Ambulatory Visit: Payer: Self-pay

## 2017-06-10 DIAGNOSIS — M545 Low back pain, unspecified: Secondary | ICD-10-CM

## 2017-06-10 DIAGNOSIS — M6281 Muscle weakness (generalized): Secondary | ICD-10-CM | POA: Diagnosis not present

## 2017-06-10 DIAGNOSIS — R293 Abnormal posture: Secondary | ICD-10-CM | POA: Diagnosis not present

## 2017-06-10 DIAGNOSIS — G8929 Other chronic pain: Secondary | ICD-10-CM | POA: Diagnosis not present

## 2017-06-10 NOTE — Therapy (Signed)
[place], [place], [address] Phone: [phone]   Fax:  [phone]  Physical Therapy Evaluation  Patient Details  Name: Jody Dixon MRN: [id number] Date of Birth: Apr 09, 1955 Referring Provider: Melina Schools    Encounter Date: 06/10/2017  PT End of Session - 06/10/17 1156    Visit Number  1    Number of Visits  17    Date for PT Re-Evaluation  07/08/17    Authorization Type  [facility] [employer] [facility]     Authorization Time Period  06/10/17 to 08/10/17    PT Start Time  1058    PT Stop Time  1145    PT Time Calculation (min)  47 min    Activity Tolerance  Patient tolerated treatment well    Behavior During Therapy  [facility] for tasks assessed/performed       Past Medical History:  Diagnosis Date  . Chronic constipation   . Common migraine with intractable migraine 10/21/2016  . Migraine   . Osteoporosis     Past Surgical History:  Procedure Laterality Date  . FRACTURE SURGERY Right    foot, crushing injury    There were no vitals filed for this visit.   Subjective Assessment - 06/10/17 1058    Subjective  I think I first hurt my back around christmas time, I had to stop spin class then because it was hurting my hips too much. I developed arthritis first, then the doctor said that I may have bursitis and that maybe I shouldn't work out as much. My pain got worse, and I was referred to an orthopedic who told me I had 3 bad discs in my back. My hips aren't as bad right now, I had an injection that helped and lidoderm patches are helping. Standing hurts, sitting hurts, laying hurts. I did have some nunbness and tingling down the side of my left leg but not right now. She does report some mild incontinence of bowel/bladder but state "its just dampness".      Pertinent History  hx foot fracture, joint replacements in toes     How long can you sit comfortably?  "not very long", does not elaborate on time even with cues     How long can you stand comfortably?  "I'm up and down all the time" does not elaborate on time     How long can you walk comfortably?  sometimes pain will shoot up my tailbone     Patient Stated Goals  reduce pain, get back to workout program, be comfortable while doing daily tasks     Currently in Pain?  Yes    Pain Score  6     Pain Location  Other (Comment) tailbone     Pain Orientation  Right;Left    Pain Descriptors / Indicators  Throbbing    Pain Type  Chronic pain    Pain Radiating Towards  up and down tailbone     Pain Onset  More than a month ago    Pain Frequency  Constant    Aggravating Factors   being in any one position too long     Pain Relieving Factors  heat, ice     Effect of Pain on Daily Activities  severe limitation          [facility] PT Assessment - 06/10/17 0001      Assessment   Medical Diagnosis  back pain     Referring Provider  Melina Schools     Onset Date/Surgical Date  -- December 2018     Next MD Visit  PRN     Prior Therapy  PT in the past for her foot       Precautions   Precautions  None      Restrictions   Weight Bearing Restrictions  No      Balance Screen   Has the patient fallen in the past 6 months  No    Has the patient had a decrease in activity level because of a fear of falling?   Yes    Is the patient reluctant to leave their home because of a fear of falling?   No      Prior Function   Level of Independence  Independent;Independent with basic ADLs    Vocation  Full time employment    Licensed conveyancer     Leisure  exercise classes, yoga on floor       Observation/Other Assessments   Observations  scour positive L hip, FABER (-) B, SLR (-) B, clonus (-) B LEs       Sensation   Light Touch  Appears Intact      ROM / Strength   AROM / PROM / Strength  AROM;Strength      AROM   AROM Assessment Site  Lumbar;Hip    Right/Left Hip  Right;Left    Lumbar Flexion  fingertips to toes; RFIS increass pain in  gluteal region  audible joint noises     Lumbar Extension  mild limitation; REIS no increase in pain     Lumbar - Right Side Bend  severe limitation     Lumbar - Left Side Bend  severe limitation       Strength   Strength Assessment Site  Hip;Knee    Right/Left Hip  Right;Left    Right Hip Flexion  4-/5    Right Hip ABduction  4/5    Left Hip Flexion  4-/5    Left Hip ABduction  3/5    Right/Left Knee  Right;Left    Right Knee Flexion  4/5    Right Knee Extension  5/5    Left Knee Flexion  4/5    Left Knee Extension  5/5      Flexibility   Soft Tissue Assessment /Muscle Length  yes    Hamstrings  R WLF, L moderate tightness     Quadriceps  WFL B     Piriformis  mild tightness L, R WFL       Palpation   Spinal mobility  severe hypomobilty sacrum, lumbar through mid thoracic     SI assessment   severe hypomobility     Palpation comment  no significant muslce knots or spasms noted in back or gluteals, tender TFL muscle groups                 Objective measurements completed on examination: See above findings.      [facility] Adult PT Treatment/Exercise - 06/10/17 0001      Exercises   Exercises  Lumbar      Lumbar Exercises: Stretches   Lower Trunk Rotation  5 reps;10 seconds    Lower Trunk Rotation Limitations  cues for form     Pelvic Tilt  10 reps    Pelvic Tilt Limitations  cues for form       Lumbar Exercises: Seated   Other Seated Lumbar Exercises  scapular retractions  1x15 cues for form              PT Education - 06/10/17 1156    Education provided  Yes    Education Details  HEP, POC, exam findings, prognosis, correct mechanics for functional bed mobility     Person(s) Educated  Patient    Methods  Explanation;Handout;Demonstration;Verbal cues    Comprehension  Verbalized understanding;Need further instruction;Verbal cues required       PT Short Term Goals - 06/10/17 1204      PT SHORT TERM GOAL #1   Title  Patient to experience pain as  being no more than 4/10 during all functional tasks and activities in order to improve QOL and task tolerance     Time  4    Period  Weeks    Status  New    Target Date  07/08/17      PT SHORT TERM GOAL #2   Title  Patient to be able to perform correct functional bed mobility and floor to overhead lifting with correct mechanics in order to prevent exacerbation of pain during daily tasks     Time  4    Period  Weeks    Status  New      PT SHORT TERM GOAL #3   Title  Patient to demonstrate improved gross joint mobility as evidenced by ability to tolerate at least grade III mobilizations without increased pain     Time  4    Period  Weeks    Status  New      PT SHORT TERM GOAL #4   Title  Patient to be compliant with appropriate and progressive HEP, to be updated PRN     Time  2    Period  Weeks    Status  New    Target Date  06/24/17        PT Long Term Goals - 06/10/17 1220      PT LONG TERM GOAL #1   Title  Patient to demonstrate MMT improvement of at least 1 grade in all tested muscle groups in order to reduce pain and improve functional task tolerance     Time  8    Period  Weeks    Status  New    Target Date  08/05/17      PT LONG TERM GOAL #2   Title  Patient to experience pain as being no more than 2/10 in order to improve tolerance to work based and functional tasks     Period  Weeks    Status  New      PT LONG TERM GOAL #3   Title  Patient to be able to tolerate at least 1/2 shift of work without increase in pain in order to improve functional task tolerance and QOL     Time  8    Period  Weeks    Status  New      PT LONG TERM GOAL #4   Title  Patient to report she has been able to tolerate at least 20 minutes of exercise at the gym at least 3 times per week in order to assist in return to PLOF     Time  [place] - 06/10/17 1157    Clinical Impression Statement  Patient presents with low back pain which  she  reports has been present since about December of last year, she is not sure if she injured or twisted her back at work or not. Of note, she is employed as a Scientist, product/process development and reports that her job does become very physical when working with this population including occasional "take downs". Exam reveals very poor posture, proximal muscle and core weakness, severe joint hypomobility, and impaired functional task mechanics. Of note, scour is positive L hip, however other special tests including clonus, light touch testing, SLR, and FABER are all negative. She will benefit from a trial of skilled PT services to address functional limitations, to reduce pain, and to assist in return to regular exercise program moving forward.     History and Personal Factors relevant to plan of care:  hx of foot fracture and joint replacements in toes, physical job as a Marine scientist     Clinical Presentation  Stable    Clinical Presentation due to:  joint stiffness, weakness, compensation patterns     Clinical Decision Making  Low    Rehab Potential  Good    PT Frequency  2x / week    PT Duration  8 weeks    PT Treatment/Interventions  ADLs/Self Care Home Management;Cryotherapy;Electrical Stimulation;Moist Heat;Ultrasound;Gait training;Stair training;Functional mobility training;Therapeutic activities;Therapeutic exercise;Balance training;Neuromuscular re-education;Patient/family education;Manual techniques;Dry needling;Taping    PT Next Visit Plan  review HEP; joint mobilizations to spine as tolerated, hip mobility, proximal strength, work on Therapist, music     PT Home Exercise Plan  Eval: pelvic tilts, lumbar rotations, scap retractions     Consulted and Agree with Plan of Care  Patient       Patient will benefit from skilled therapeutic intervention in order to improve the following deficits and impairments:  Improper body mechanics, Pain, Decreased mobility, Postural dysfunction, Hypomobility, Decreased strength,  Decreased balance, Difficulty walking, Impaired flexibility  Visit Diagnosis: Chronic midline low back pain without sciatica - Plan: PT plan of care cert/re-cert  Muscle weakness (generalized) - Plan: PT plan of care cert/re-cert  Abnormal posture - Plan: PT plan of care cert/re-cert     Problem List Patient Active Problem List   Diagnosis Date Noted  . Common migraine with intractable migraine 10/21/2016    Deniece Ree PT, DPT, CBIS  Supplemental Physical Therapist [place]   Pager [place] [facility] [address] [place], [place], [address] Phone: [phone]   Fax:  [phone]  Name: Jody Dixon MRN: [id number] Date of Birth: Mar 04, 1956

## 2017-06-10 NOTE — Patient Instructions (Signed)
   SCAPULAR RETRACTIONS  Draw your shoulder blades back and down. It should feel like you are pinching your shoulder blades together.  Repeat 15 times, twice a day.    Lumbar Rotations   Lying on your back with your knees bent, slowly drop your legs to one side and hold the stretch. Come back to the middle and switch sides. You should feel the stretch in your back on the opposite side that your legs are leaning.  When you feel a stretch, hold for 10 seconds, then relax and return to center. Then let your legs drop to the other side and repeat.  Repeat 3-5 times each side, 2-3 times per day or as needed.     While laying on your back with your knees bent, try to flatten your low back on to the table. Hold for 1-2 seconds.  Then arch your back up off of the table as high as you comfortably can. Hold for 1-2 seconds.  Repeat 10 times, 2-3 times per day or as needed.

## 2017-06-12 ENCOUNTER — Ambulatory Visit: Payer: 59 | Admitting: Physical Therapy

## 2017-06-12 ENCOUNTER — Encounter: Payer: Self-pay | Admitting: Physical Therapy

## 2017-06-12 DIAGNOSIS — G8929 Other chronic pain: Secondary | ICD-10-CM

## 2017-06-12 DIAGNOSIS — R293 Abnormal posture: Secondary | ICD-10-CM | POA: Diagnosis not present

## 2017-06-12 DIAGNOSIS — M545 Low back pain: Principal | ICD-10-CM

## 2017-06-12 DIAGNOSIS — M6281 Muscle weakness (generalized): Secondary | ICD-10-CM | POA: Diagnosis not present

## 2017-06-12 NOTE — Therapy (Signed)
[place], [place], [address] Phone: [phone]   Fax:  [phone]  Physical Therapy Treatment  Patient Details  Name: Jody Dixon MRN: [id number] Date of Birth: September 21, 1955 Referring Provider: Melina Schools    Encounter Date: 06/12/2017  PT End of Session - 06/12/17 1622    Visit Number  2    Number of Visits  17    Date for PT Re-Evaluation  07/08/17    Authorization Type  [employer] [facility]     Authorization Time Period  06/10/17 to 08/10/17    PT Start Time  1330    PT Stop Time  1409 subtracted time on moist heat     PT Time Calculation (min)  39 min    Activity Tolerance  Patient tolerated treatment well    Behavior During Therapy  [facility] for tasks assessed/performed       Past Medical History:  Diagnosis Date  . Chronic constipation   . Common migraine with intractable migraine 10/21/2016  . Migraine   . Osteoporosis     Past Surgical History:  Procedure Laterality Date  . FRACTURE SURGERY Right    foot, crushing injury    There were no vitals filed for this visit.  Subjective Assessment - 06/12/17 1333    Subjective  My shoulder hurts today, maybe I got my shoulder pulled by a dog leash. I felt better yesterday. Sometimes I get some pain in my back with some of the exercises but nothing major.     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  Yes    Pain Score  4     Pain Location  -- tailbone area     Pain Orientation  Right;Left    Pain Descriptors / Indicators  Throbbing                       [facility] Adult PT Treatment/Exercise - 06/12/17 0001      Therapeutic Activites    Therapeutic Activities  Other Therapeutic Activities functional bed mechanics       Lumbar Exercises: Standing   Other Standing Lumbar Exercises   standing hip hikes 1x10 mod cues       Lumbar Exercises: Supine   Clam  10 reps red TB     Bent Knee Raise  10 reps core set     Dead Bug  10 reps core set     Bridge  10 reps    Other Supine Lumbar Exercises  laying over towel roll for improved extension/joint mobility in lumbar and thoracic spines x2 minutes each     Other Supine Lumbar Exercises  supine hip ABD red TB 1x10       Modalities   Modalities  Moist Heat      Moist Heat Therapy   Number Minutes Moist Heat  6 Minutes    Moist Heat Location  Lumbar Spine not included in billing       Manual Therapy   Manual Therapy  Joint mobilization    Manual therapy comments  separate from all other services     Joint Mobilization  grade I PAs lumbar spine-T7              PT Education - 06/12/17 1622  Education provided  Yes    Education Details  cues for form all exercises today, benefits of exercises and reasoning for activities performed today     Person(s) Educated  Patient    Methods  Explanation    Comprehension  Verbalized understanding;Need further instruction       PT Short Term Goals - 06/10/17 1204      PT SHORT TERM GOAL #1   Title  Patient to experience pain as being no more than 4/10 during all functional tasks and activities in order to improve QOL and task tolerance     Time  4    Period  Weeks    Status  New    Target Date  07/08/17      PT SHORT TERM GOAL #2   Title  Patient to be able to perform correct functional bed mobility and floor to overhead lifting with correct mechanics in order to prevent exacerbation of pain during daily tasks     Time  4    Period  Weeks    Status  New      PT SHORT TERM GOAL #3   Title  Patient to demonstrate improved gross joint mobility as evidenced by ability to tolerate at least grade III mobilizations without increased pain     Time  4    Period  Weeks    Status  New      PT SHORT TERM GOAL #4   Title  Patient to be compliant with appropriate and  progressive HEP, to be updated PRN     Time  2    Period  Weeks    Status  New    Target Date  06/24/17        PT Long Term Goals - 06/10/17 1220      PT LONG TERM GOAL #1   Title  Patient to demonstrate MMT improvement of at least 1 grade in all tested muscle groups in order to reduce pain and improve functional task tolerance     Time  8    Period  Weeks    Status  New    Target Date  08/05/17      PT LONG TERM GOAL #2   Title  Patient to experience pain as being no more than 2/10 in order to improve tolerance to work based and functional tasks     Period  Weeks    Status  New      PT LONG TERM GOAL #3   Title  Patient to be able to tolerate at least 1/2 shift of work without increase in pain in order to improve functional task tolerance and QOL     Time  8    Period  Weeks    Status  New      PT LONG TERM GOAL #4   Title  Patient to report she has been able to tolerate at least 20 minutes of exercise at the gym at least 3 times per week in order to assist in return to PLOF     Time  8    Period  Weeks    Status  New            Plan - 06/12/17 1623    Clinical Impression Statement  Patient arrives reporting she is feeling a bit better today, but her shoulder is hurting, she is not sure why. Began with moist heat (not included in billing) to reduce pain in area, then  proceeded with joint mobilizations as tolerated to lumbar spine and sacrum. Otherwise worked on proximal strengthening, joint mobilizations/mobility, and core activation today. Patient continues to do well and remains motivated to participate with PT moving forward.     Rehab Potential  Good    PT Frequency  2x / week    PT Duration  8 weeks    PT Treatment/Interventions  ADLs/Self Care Home Management;Cryotherapy;Electrical Stimulation;Moist Heat;Ultrasound;Gait training;Stair training;Functional mobility training;Therapeutic activities;Therapeutic exercise;Balance training;Neuromuscular  re-education;Patient/family education;Manual techniques;Dry needling;Taping    PT Next Visit Plan  begin with moist heat and continue with spine PAs as tolerated. Continue working on hip mobility and proximal strength, review functional mechanics. Postural training.     PT Home Exercise Plan  Eval: pelvic tilts, lumbar rotations, scap retractions     Consulted and Agree with Plan of Care  Patient       Patient will benefit from skilled therapeutic intervention in order to improve the following deficits and impairments:  Improper body mechanics, Pain, Decreased mobility, Postural dysfunction, Hypomobility, Decreased strength, Decreased balance, Difficulty walking, Impaired flexibility  Visit Diagnosis: Chronic midline low back pain without sciatica  Muscle weakness (generalized)  Abnormal posture     Problem List Patient Active Problem List   Diagnosis Date Noted  . Common migraine with intractable migraine 10/21/2016    Deniece Ree PT, DPT, CBIS  Supplemental Physical Therapist [place]   Pager [place] [facility] [address] [place], [place], [address] Phone: [phone]   Fax:  [phone]  Name: Jody Dixon MRN: [id number] Date of Birth: 1955-07-19

## 2017-06-17 ENCOUNTER — Ambulatory Visit: Payer: 59 | Admitting: Physical Therapy

## 2017-06-17 ENCOUNTER — Encounter: Payer: Self-pay | Admitting: Physical Therapy

## 2017-06-17 DIAGNOSIS — G8929 Other chronic pain: Secondary | ICD-10-CM

## 2017-06-17 DIAGNOSIS — M545 Low back pain, unspecified: Secondary | ICD-10-CM

## 2017-06-17 DIAGNOSIS — R293 Abnormal posture: Secondary | ICD-10-CM | POA: Diagnosis not present

## 2017-06-17 DIAGNOSIS — M6281 Muscle weakness (generalized): Secondary | ICD-10-CM | POA: Diagnosis not present

## 2017-06-17 NOTE — Therapy (Signed)
[place], [place], [address] Phone: [phone]   Fax:  [phone]  Physical Therapy Treatment  Patient Details  Name: Jody Dixon MRN: [id number] Date of Birth: October 04, 1955 Referring Provider: Melina Schools    Encounter Date: 06/17/2017  PT End of Session - 06/17/17 1203    Visit Number  3    Number of Visits  17    Date for PT Re-Evaluation  07/08/17    PT Start Time  1103    PT Stop Time  1146    PT Time Calculation (min)  43 min    Behavior During Therapy  [facility] for tasks assessed/performed       Past Medical History:  Diagnosis Date  . Chronic constipation   . Common migraine with intractable migraine 10/21/2016  . Migraine   . Osteoporosis     Past Surgical History:  Procedure Laterality Date  . FRACTURE SURGERY Right    foot, crushing injury    There were no vitals filed for this visit.  Subjective Assessment - 06/17/17 1107    Subjective  I started a headach.  i worked an extra shift last night. Hip pain left does not seem to be as bad.    Pain Score  7     Pain Location  Back tailbone    Pain Descriptors / Indicators  Stabbing;Constant;Headache    Pain Type  Chronic pain    Pain Frequency  Constant    Aggravating Factors   grocery shopping,  working    Pain Relieving Factors  heating pad,  lidocain patch    Effect of Pain on Daily Activities  limitations ADL sleeping                        [facility] Adult PT Treatment/Exercise - 06/17/17 0001      Self-Care   Self-Care  ADL's;Lifting    ADL's  handout issued,  briefly reviewed 2-3 things    Lifting  practiced getting papers off mat without forward trunk lean over,  using knees,  needs cues      Lumbar Exercises: Stretches   Passive Hamstring Stretch  -- attempted too painful,  limited motion    Other Lumbar Stretch Exercise  child's pose,  and stretch to side for Quadratus lumborum,   quadriped stretch  diagonally with left hand on edge of mat for extra stretch    Other Lumbar Stretch Exercise  Standing arms overhead gentle stretch away from pain 5 reps 10-20 seconds  ,   Quadratus Lumborum stretchon right side lifting right arm       Lumbar Exercises: Supine   Other Supine Lumbar Exercises  decompression position with hshoulder press,  head press 5 x 5 seconds.  heavy cues for head press neutral position.       Modalities   Modalities  Moist Heat      Moist Heat Therapy   Number Minutes Moist Heat  -- concurrent with supine PROM    Moist Heat Location  Lumbar Spine      Manual Therapy   Manual therapy comments  strumming Left QL during stretch PROM both hips flexion, IR/ER  small movements to avoid pain    Passive ROM  hips see above             PT Education - 06/17/17 1146    Education provided  Yes    Education Details  HEP,  ADL handout    Person(s) Educated  Patient    Methods  Explanation;Demonstration;Tactile cues;Verbal cues;Handout    Comprehension  Returned demonstration;Need further instruction       PT Short Term Goals - 06/10/17 1204      PT SHORT TERM GOAL #1   Title  Patient to experience pain as being no more than 4/10 during all functional tasks and activities in order to improve QOL and task tolerance     Time  4    Period  Weeks    Status  New    Target Date  07/08/17      PT SHORT TERM GOAL #2   Title  Patient to be able to perform correct functional bed mobility and floor to overhead lifting with correct mechanics in order to prevent exacerbation of pain during daily tasks     Time  4    Period  Weeks    Status  New      PT SHORT TERM GOAL #3   Title  Patient to demonstrate improved gross joint mobility as evidenced by ability to tolerate at least grade III mobilizations without increased pain     Time  4    Period  Weeks    Status  New      PT SHORT TERM GOAL #4   Title  Patient to be compliant with appropriate and progressive HEP, to be  updated PRN     Time  2    Period  Weeks    Status  New    Target Date  06/24/17        PT Long Term Goals - 06/10/17 1220      PT LONG TERM GOAL #1   Title  Patient to demonstrate MMT improvement of at least 1 grade in all tested muscle groups in order to reduce pain and improve functional task tolerance     Time  8    Period  Weeks    Status  New    Target Date  08/05/17      PT LONG TERM GOAL #2   Title  Patient to experience pain as being no more than 2/10 in order to improve tolerance to work based and functional tasks     Period  Weeks    Status  New      PT LONG TERM GOAL #3   Title  Patient to be able to tolerate at least 1/2 shift of work without increase in pain in order to improve functional task tolerance and QOL     Time  8    Period  Weeks    Status  New      PT LONG TERM GOAL #4   Title  Patient to report she has been able to tolerate at least 20 minutes of exercise at the gym at least 3 times per week in order to assist in return to PLOF     Time  [place] - 06/17/17 1204    Clinical Impression Statement  Patient arrived with 7/10 pain and left with 3/10 pain.  QL were in spasm and did not tolerate more than light trigger point release and strumming.  QL stretches helpful so added to HEP.      PT Next Visit Plan  begin with moist heat and continue with spine PAs as tolerated.  Also check for Quadratus lumborum tightness and stretch if tight.  Continue working on hip mobility and proximal strength, review functional mechanics. Postural training.     PT Home Exercise Plan  Eval: pelvic tilts, lumbar rotations, scap retractions     Consulted and Agree with Plan of Care  Patient       Patient will benefit from skilled therapeutic intervention in order to improve the following deficits and impairments:     Visit Diagnosis: Chronic midline low back pain without sciatica  Muscle weakness (generalized)  Abnormal  posture     Problem List Patient Active Problem List   Diagnosis Date Noted  . Common migraine with intractable migraine 10/21/2016    HARRIS,KAREN  PTA 06/17/2017, 12:12 PM  [facility] [address] [place], [place], [address] Phone: [phone]   Fax:  [phone]  Name: Jody Dixon MRN: [id number] Date of Birth: 1956-01-10

## 2017-06-17 NOTE — Patient Instructions (Addendum)

## 2017-06-19 ENCOUNTER — Encounter: Payer: Self-pay | Admitting: Physical Therapy

## 2017-06-19 ENCOUNTER — Ambulatory Visit: Payer: 59 | Admitting: Physical Therapy

## 2017-06-19 DIAGNOSIS — R293 Abnormal posture: Secondary | ICD-10-CM | POA: Diagnosis not present

## 2017-06-19 DIAGNOSIS — M545 Low back pain, unspecified: Secondary | ICD-10-CM

## 2017-06-19 DIAGNOSIS — G8929 Other chronic pain: Secondary | ICD-10-CM

## 2017-06-19 DIAGNOSIS — M6281 Muscle weakness (generalized): Secondary | ICD-10-CM | POA: Diagnosis not present

## 2017-06-19 NOTE — Therapy (Signed)
[place], [place], [address] Phone: [phone]   Fax:  [phone]  Physical Therapy Treatment  Patient Details  Name: Jody Dixon MRN: [id number] Date of Birth: 1955/06/29 Referring Provider: Melina Schools    Encounter Date: 06/19/2017  PT End of Session - 06/19/17 1058    Visit Number  4    Number of Visits  17    Date for PT Re-Evaluation  07/08/17    Authorization Type  [employer]     Authorization Time Period  06/10/17 to 08/10/17    PT Start Time  1016    PT Stop Time  1056    PT Time Calculation (min)  40 min    Activity Tolerance  Patient tolerated treatment well    Behavior During Therapy  [facility] for tasks assessed/performed       Past Medical History:  Diagnosis Date  . Chronic constipation   . Common migraine with intractable migraine 10/21/2016  . Migraine   . Osteoporosis     Past Surgical History:  Procedure Laterality Date  . FRACTURE SURGERY Right    foot, crushing injury    There were no vitals filed for this visit.  Subjective Assessment - 06/19/17 1018    Subjective  I felt good after last session, I feel like I've been having trouble doing them right. I'm just sore right now. 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  Yes    Pain Score  7     Pain Location  Buttocks    Pain Orientation  Right    Pain Descriptors / Indicators  Sore    Pain Type  Chronic pain    Pain Radiating Towards  up and down tailbone     Pain Onset  More than a month ago    Pain Frequency  Constant                       [facility] Adult PT Treatment/Exercise - 06/19/17 0001      Lumbar Exercises: Stretches   Other Lumbar Stretch  Exercise  childs pose 6x30 seconds- 2 in middle, 2 to either side for QL       Lumbar Exercises: Supine   Pelvic Tilt Limitations  pelvic flattening 1x15     Bridge  15 reps    Bridge with March  --    Other Supine Lumbar Exercises  decompression exercises 2-5 1x10 while on moist heat pack       Manual Therapy   Manual Therapy  Soft tissue mobilization    Manual therapy comments  separate from all other skiled services     Soft tissue mobilization  B QLs with ball assist prone  after moist heat              PT Education - 06/19/17 1058    Education provided  No       PT Short Term Goals - 06/10/17 1204      PT SHORT TERM GOAL #1   Title  Patient to experience pain as being no more than 4/10 during all functional tasks and activities in order  to improve QOL and task tolerance     Time  4    Period  Weeks    Status  New    Target Date  07/08/17      PT SHORT TERM GOAL #2   Title  Patient to be able to perform correct functional bed mobility and floor to overhead lifting with correct mechanics in order to prevent exacerbation of pain during daily tasks     Time  4    Period  Weeks    Status  New      PT SHORT TERM GOAL #3   Title  Patient to demonstrate improved gross joint mobility as evidenced by ability to tolerate at least grade III mobilizations without increased pain     Time  4    Period  Weeks    Status  New      PT SHORT TERM GOAL #4   Title  Patient to be compliant with appropriate and progressive HEP, to be updated PRN     Time  2    Period  Weeks    Status  New    Target Date  06/24/17        PT Long Term Goals - 06/10/17 1220      PT LONG TERM GOAL #1   Title  Patient to demonstrate MMT improvement of at least 1 grade in all tested muscle groups in order to reduce pain and improve functional task tolerance     Time  8    Period  Weeks    Status  New    Target Date  08/05/17      PT LONG TERM GOAL #2   Title  Patient to experience pain as  being no more than 2/10 in order to improve tolerance to work based and functional tasks     Period  Weeks    Status  New      PT LONG TERM GOAL #3   Title  Patient to be able to tolerate at least 1/2 shift of work without increase in pain in order to improve functional task tolerance and QOL     Time  8    Period  Weeks    Status  New      PT LONG TERM GOAL #4   Title  Patient to report she has been able to tolerate at least 20 minutes of exercise at the gym at least 3 times per week in order to assist in return to PLOF     Time  [place] - 06/19/17 1058    Clinical Impression Statement  Patient arrives today reporting ongoing soreness around her tailbone area which does not seem to be improving but feels that the QL stretches given last time have been very beneficial however she would like to review them. Continued with work on QL stretches and STM as tolerated to this area after moist heat was applied today. Otherwise continued with joint mobilization and proximal strengthening this session; note patient appears to have difficulty in retaining correct form for functional mechanics, states "I thought I did good" when using very poor mechanics getting off of table today.     Rehab Potential  Good    PT Frequency  2x / week    PT Duration  8 weeks    PT Treatment/Interventions  ADLs/Self  Care Home Management;Cryotherapy;Electrical Stimulation;Moist Heat;Ultrasound;Gait training;Stair training;Functional mobility training;Therapeutic activities;Therapeutic exercise;Balance training;Neuromuscular re-education;Patient/family education;Manual techniques;Dry needling;Taping    PT Next Visit Plan  begin with moist heat and continue with spine PAs as tolerated.  Also check for Quadratus lumborum tightness and stretch if tight.  Continue working on hip mobility and proximal strength, review functional mechanics. Postural training.     PT Home Exercise  Plan  Eval: pelvic tilts, lumbar rotations, scap retractions     Consulted and Agree with Plan of Care  Patient       Patient will benefit from skilled therapeutic intervention in order to improve the following deficits and impairments:  Improper body mechanics, Pain, Decreased mobility, Postural dysfunction, Hypomobility, Decreased strength, Decreased balance, Difficulty walking, Impaired flexibility  Visit Diagnosis: Chronic midline low back pain without sciatica  Muscle weakness (generalized)  Abnormal posture     Problem List Patient Active Problem List   Diagnosis Date Noted  . Common migraine with intractable migraine 10/21/2016    Deniece Ree PT, DPT, CBIS  Supplemental Physical Therapist [place]   Pager [place] [facility] [address] [place], [place], [address] Phone: [phone]   Fax:  [phone]  Name: Jody Dixon MRN: [id number] Date of Birth: 1955/05/23

## 2017-06-24 ENCOUNTER — Ambulatory Visit: Payer: 59 | Admitting: Physical Therapy

## 2017-06-24 ENCOUNTER — Encounter: Payer: Self-pay | Admitting: Physical Therapy

## 2017-06-24 DIAGNOSIS — R293 Abnormal posture: Secondary | ICD-10-CM | POA: Diagnosis not present

## 2017-06-24 DIAGNOSIS — G8929 Other chronic pain: Secondary | ICD-10-CM | POA: Diagnosis not present

## 2017-06-24 DIAGNOSIS — M545 Low back pain: Principal | ICD-10-CM

## 2017-06-24 DIAGNOSIS — M6281 Muscle weakness (generalized): Secondary | ICD-10-CM | POA: Diagnosis not present

## 2017-06-24 NOTE — Therapy (Signed)
[place], [place], [address] Phone: [phone]   Fax:  [phone]  Physical Therapy Treatment  Patient Details  Name: Jody Dixon MRN: [id number] Date of Birth: 1955-10-03 Referring Provider: Melina Schools    Encounter Date: 06/24/2017  PT End of Session - 06/24/17 1059    Visit Number  5    Number of Visits  17    Date for PT Re-Evaluation  07/08/17    Authorization Type  [employer]     Authorization Time Period  06/10/17 to 08/10/17    PT Start Time  1023 moist heat not included in billing     PT Stop Time  1058    PT Time Calculation (min)  35 min    Activity Tolerance  Patient tolerated treatment well    Behavior During Therapy  [facility] for tasks assessed/performed       Past Medical History:  Diagnosis Date  . Chronic constipation   . Common migraine with intractable migraine 10/21/2016  . Migraine   . Osteoporosis     Past Surgical History:  Procedure Laterality Date  . FRACTURE SURGERY Right    foot, crushing injury    There were no vitals filed for this visit.  Subjective Assessment - 06/24/17 1018    Subjective  I felt OK after last session, it started throbbing soon after on the right though. I worked extra yesterday so I don't know if that made a difference. My daughter's dog slept in the bed with me yesterday and I ended up in an odd position. I am having a hard time standing up striaght today.     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  Yes    Pain Score  7     Pain Location  Buttocks still in tailbone area     Pain Orientation  Right    Pain Descriptors / Indicators  Sore;Pressure                       [facility] Adult PT Treatment/Exercise - 06/24/17 0001      Lumbar Exercises: Stretches   Other Lumbar  Stretch Exercise  L knee to chest wit hmanual overpressure; L hip ER/IR stretch with manual overpressure     Other Lumbar Stretch Exercise  childs pose x6 (2 midline, 2 to eihter side) x30 seconds       Modalities   Modalities  Moist Heat      Moist Heat Therapy   Number Minutes Moist Heat  8 Minutes    Moist Heat Location  Lumbar Spine      Manual Therapy   Manual Therapy  Muscle Energy Technique    Manual therapy comments  separate from all other skiled services     Soft tissue mobilization  B QLs with ball assist prone  after moist heat     Muscle Energy Technique  MET for LLD (L longer than R in supine)             PT Education - 06/24/17 1058    Education provided  Yes    Education Details  exercise reocommendations, POC moving forward , MET for LLD     Person(s) Educated  Patient    Methods  Explanation  Comprehension  Verbalized understanding       PT Short Term Goals - 06/10/17 1204      PT SHORT TERM GOAL #1   Title  Patient to experience pain as being no more than 4/10 during all functional tasks and activities in order to improve QOL and task tolerance     Time  4    Period  Weeks    Status  New    Target Date  07/08/17      PT SHORT TERM GOAL #2   Title  Patient to be able to perform correct functional bed mobility and floor to overhead lifting with correct mechanics in order to prevent exacerbation of pain during daily tasks     Time  4    Period  Weeks    Status  New      PT SHORT TERM GOAL #3   Title  Patient to demonstrate improved gross joint mobility as evidenced by ability to tolerate at least grade III mobilizations without increased pain     Time  4    Period  Weeks    Status  New      PT SHORT TERM GOAL #4   Title  Patient to be compliant with appropriate and progressive HEP, to be updated PRN     Time  2    Period  Weeks    Status  New    Target Date  06/24/17        PT Long Term Goals - 06/10/17 1220      PT LONG TERM GOAL #1    Title  Patient to demonstrate MMT improvement of at least 1 grade in all tested muscle groups in order to reduce pain and improve functional task tolerance     Time  8    Period  Weeks    Status  New    Target Date  08/05/17      PT LONG TERM GOAL #2   Title  Patient to experience pain as being no more than 2/10 in order to improve tolerance to work based and functional tasks     Period  Weeks    Status  New      PT LONG TERM GOAL #3   Title  Patient to be able to tolerate at least 1/2 shift of work without increase in pain in order to improve functional task tolerance and QOL     Time  8    Period  Weeks    Status  New      PT LONG TERM GOAL #4   Title  Patient to report she has been able to tolerate at least 20 minutes of exercise at the gym at least 3 times per week in order to assist in return to PLOF     Time  [place] - 06/24/17 1059    Clinical Impression Statement  Patient arrives reporting ongoing symptoms in tailbone and back region- "it feels like constant pressure". Continued to begin session on moist heat (not included in billing) and focused on functional stretching and mobility for lumbar spine and hips today as tolerated by patient. Noted functional LLD that was able to be reduced with MET techniques, improved symptoms following this MET and L hip IR/ER stretches today. She remains frustrated regarding the cause and ongoing nature of her symptoms.  Pain reduced to 4/10 at EOS.     Rehab Potential  Good    PT Frequency  2x / week    PT Duration  8 weeks    PT Treatment/Interventions  ADLs/Self Care Home Management;Cryotherapy;Electrical Stimulation;Moist Heat;Ultrasound;Gait training;Stair training;Functional mobility training;Therapeutic activities;Therapeutic exercise;Balance training;Neuromuscular re-education;Patient/family education;Manual techniques;Dry needling;Taping    PT Next Visit Plan  begin with moist heat and  continue with spine PAs as tolerated. Continue with MET for LLD. Work on hip IR ROM.  Also check for Quadratus lumborum tightness and stretch if tight.  Continue working on hip mobility and proximal strength, review functional mechanics. Postural training.     PT Home Exercise Plan  Eval: pelvic tilts, lumbar rotations, scap retractions     Consulted and Agree with Plan of Care  Patient       Patient will benefit from skilled therapeutic intervention in order to improve the following deficits and impairments:  Improper body mechanics, Pain, Decreased mobility, Postural dysfunction, Hypomobility, Decreased strength, Decreased balance, Difficulty walking, Impaired flexibility  Visit Diagnosis: Chronic midline low back pain without sciatica  Muscle weakness (generalized)  Abnormal posture     Problem List Patient Active Problem List   Diagnosis Date Noted  . Common migraine with intractable migraine 10/21/2016    Deniece Ree PT, DPT, CBIS  Supplemental Physical Therapist [place]   Pager [facility] [address] [place], [place], [address] Phone: [phone]   Fax:  [phone]  Name: MANAAL MANDALA MRN: [id number] Date of Birth: 1955/03/12

## 2017-06-26 ENCOUNTER — Encounter: Payer: Self-pay | Admitting: Physical Therapy

## 2017-06-26 ENCOUNTER — Ambulatory Visit: Payer: 59 | Admitting: Physical Therapy

## 2017-06-26 DIAGNOSIS — G8929 Other chronic pain: Secondary | ICD-10-CM | POA: Diagnosis not present

## 2017-06-26 DIAGNOSIS — M545 Low back pain, unspecified: Secondary | ICD-10-CM

## 2017-06-26 DIAGNOSIS — M6281 Muscle weakness (generalized): Secondary | ICD-10-CM | POA: Diagnosis not present

## 2017-06-26 DIAGNOSIS — R293 Abnormal posture: Secondary | ICD-10-CM | POA: Diagnosis not present

## 2017-06-26 NOTE — Therapy (Signed)
[place], [place], [address] Phone: [phone]   Fax:  [phone]  Physical Therapy Treatment  Patient Details  Name: Jody Dixon MRN: [id number] Date of Birth: [birth year] Referring Provider: Melina Schools    Encounter Date: 06/26/2017  PT End of Session - 06/26/17 1245    Visit Number  6    Number of Visits  17    Date for PT Re-Evaluation  07/08/17    Authorization Type  [employer]     Authorization Time Period  06/10/17 to 08/10/17    PT Start Time  1023 time on moist heat not included in billing     PT Stop Time  1100    PT Time Calculation (min)  37 min    Activity Tolerance  Patient tolerated treatment well    Behavior During Therapy  [facility] for tasks assessed/performed       Past Medical History:  Diagnosis Date  . Chronic constipation   . Common migraine with intractable migraine 10/21/2016  . Migraine   . Osteoporosis     Past Surgical History:  Procedure Laterality Date  . FRACTURE SURGERY Right    foot, crushing injury    There were no vitals filed for this visit.  Subjective Assessment - 06/26/17 1018    Subjective  I felt better after last session until I went up some stairs then I started to feel bad again; the massage seemed to help a lot last time. When I sit straight up I feel pain in my low back, I have a hard time sitting straight up. My tailbone is sitll bothering me. I still see my rheumatic doctor Monday. I feel like I slept funny last night, the dog is still sleeping with me in the bed.     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  Yes    Pain Score  8     Pain Location  Other (Comment) L shoulder, R low back, tailbone     Pain Orientation  Right    Pain Descriptors / Indicators  Other (Comment);Pressure;Discomfort  "like I was kicked in the back"     Pain Type  Chronic pain    Pain Radiating Towards  radiating up and down tailbone     Pain Onset  More than a month ago    Pain Frequency  Constant    Aggravating Factors   sitting up straight, working, sitting     Pain Relieving Factors  heat, my lidocain patch    Effect of Pain on Daily Activities  moderate                        [facility] Adult PT Treatment/Exercise - 06/26/17 0001      Lumbar Exercises: Stretches   Other Lumbar Stretch Exercise  L knee to chest wit hover pressure, IR/ER hip stretches B       Modalities   Modalities  Moist Heat      Moist Heat Therapy   Number Minutes Moist Heat  8 Minutes    Moist Heat Location  Lumbar Spine      Manual Therapy   Manual Therapy  Muscle Energy Technique;Soft tissue mobilization    Manual therapy comments  separate from all  other skiled services     Soft tissue mobilization  B QLs with ball assist prone  after moist heat     Muscle Energy Technique  MET for LLD (L longer than R in supine)       Trigger Point Dry Needling - 06/26/17 1058    Consent Given?  Yes    Education Handout Provided  Yes    Muscles Treated Upper Body  Quadratus Lumborum    Muscles Treated Lower Body  -- bilatearl QL muscle groups            PT Education - 06/26/17 1244    Education provided  Yes    Education Details  education regarding dry needling, anatomy related to affected muscle groups, benefits of dry needling, procedure for dry needling, possible symptoms following dry needling     Person(s) Educated  Patient    Methods  Explanation    Comprehension  Verbalized understanding;Need further instruction       PT Short Term Goals - 06/10/17 1204      PT SHORT TERM GOAL #1   Title  Patient to experience pain as being no more than 4/10 during all functional tasks and activities in order to improve QOL and task tolerance     Time  4    Period  Weeks    Status  New    Target Date   07/08/17      PT SHORT TERM GOAL #2   Title  Patient to be able to perform correct functional bed mobility and floor to overhead lifting with correct mechanics in order to prevent exacerbation of pain during daily tasks     Time  4    Period  Weeks    Status  New      PT SHORT TERM GOAL #3   Title  Patient to demonstrate improved gross joint mobility as evidenced by ability to tolerate at least grade III mobilizations without increased pain     Time  4    Period  Weeks    Status  New      PT SHORT TERM GOAL #4   Title  Patient to be compliant with appropriate and progressive HEP, to be updated PRN     Time  2    Period  Weeks    Status  New    Target Date  06/24/17        PT Long Term Goals - 06/10/17 1220      PT LONG TERM GOAL #1   Title  Patient to demonstrate MMT improvement of at least 1 grade in all tested muscle groups in order to reduce pain and improve functional task tolerance     Time  8    Period  Weeks    Status  New    Target Date  08/05/17      PT LONG TERM GOAL #2   Title  Patient to experience pain as being no more than 2/10 in order to improve tolerance to work based and functional tasks     Period  Weeks    Status  New      PT LONG TERM GOAL #3   Title  Patient to be able to tolerate at least 1/2 shift of work without increase in pain in order to improve functional task tolerance and QOL     Time  8    Period  Weeks    Status  New  PT LONG TERM GOAL #4   Title  Patient to report she has been able to tolerate at least 20 minutes of exercise at the gym at least 3 times per week in order to assist in return to PLOF     Time  [place] - 06/26/17 1246    Clinical Impression Statement  Patient arrives with increased pain today, reports she felt good after last session until she did some stairs and had to do some work rolling up a carpet. She is having a hard time sitting/standing up straight today due  to pain. Continued with moist heat today due to pain reducing effects. Otherwise continued with stretching and manual interventions, MET correction, and also introduced dry needling to QL groups today which was performed by a dry needling certified clinician Carlus Pavlov).     Rehab Potential  Good    PT Frequency  2x / week    PT Duration  8 weeks    PT Treatment/Interventions  ADLs/Self Care Home Management;Cryotherapy;Electrical Stimulation;Moist Heat;Ultrasound;Gait training;Stair training;Functional mobility training;Therapeutic activities;Therapeutic exercise;Balance training;Neuromuscular re-education;Patient/family education;Manual techniques;Dry needling;Taping    PT Next Visit Plan  conitnue moist heat, continue dry needling if beneficial and appropriate. Postural and mechanics training.     PT Home Exercise Plan  Eval: pelvic tilts, lumbar rotations, scap retractions     Consulted and Agree with Plan of Care  Patient       Patient will benefit from skilled therapeutic intervention in order to improve the following deficits and impairments:  Improper body mechanics, Pain, Decreased mobility, Postural dysfunction, Hypomobility, Decreased strength, Decreased balance, Difficulty walking, Impaired flexibility  Visit Diagnosis: Chronic midline low back pain without sciatica  Muscle weakness (generalized)  Abnormal posture     Problem List Patient Active Problem List   Diagnosis Date Noted  . Common migraine with intractable migraine 10/21/2016    Deniece Ree PT, DPT, CBIS  Supplemental Physical Therapist [place]   Pager [place] [facility] [address] [place], [place], [address] Phone: [phone]   Fax:  [phone]  Name: Jody Dixon MRN: [id number] Date of Birth: 07-May-1955

## 2017-06-30 DIAGNOSIS — M150 Primary generalized (osteo)arthritis: Secondary | ICD-10-CM | POA: Diagnosis not present

## 2017-06-30 DIAGNOSIS — M5136 Other intervertebral disc degeneration, lumbar region: Secondary | ICD-10-CM | POA: Diagnosis not present

## 2017-06-30 DIAGNOSIS — Z79899 Other long term (current) drug therapy: Secondary | ICD-10-CM | POA: Diagnosis not present

## 2017-06-30 DIAGNOSIS — M2550 Pain in unspecified joint: Secondary | ICD-10-CM | POA: Diagnosis not present

## 2017-06-30 DIAGNOSIS — R768 Other specified abnormal immunological findings in serum: Secondary | ICD-10-CM | POA: Diagnosis not present

## 2017-06-30 DIAGNOSIS — Z6823 Body mass index (BMI) 23.0-23.9, adult: Secondary | ICD-10-CM | POA: Diagnosis not present

## 2017-06-30 DIAGNOSIS — M810 Age-related osteoporosis without current pathological fracture: Secondary | ICD-10-CM | POA: Diagnosis not present

## 2017-06-30 MED FILL — HYDROXYCHLOROQUINE 200 MG T: 200 | 90 days supply | Qty: 180 | Fill #0

## 2017-06-30 MED FILL — DICLOFENAC SOD EC 75 MG TAB: 75 | 90 days supply | Qty: 180 | Fill #0

## 2017-06-30 MED FILL — DICLOFENAC SODIUM 1% GEL: 1 | 90 days supply | Qty: 300 | Fill #0

## 2017-07-01 ENCOUNTER — Encounter: Payer: Self-pay | Admitting: Physical Therapy

## 2017-07-01 ENCOUNTER — Ambulatory Visit: Payer: 59 | Admitting: Physical Therapy

## 2017-07-01 DIAGNOSIS — G8929 Other chronic pain: Secondary | ICD-10-CM

## 2017-07-01 DIAGNOSIS — M545 Low back pain, unspecified: Secondary | ICD-10-CM

## 2017-07-01 DIAGNOSIS — M6281 Muscle weakness (generalized): Secondary | ICD-10-CM

## 2017-07-01 DIAGNOSIS — R293 Abnormal posture: Secondary | ICD-10-CM

## 2017-07-01 NOTE — Therapy (Signed)
[place], [place], [address] Phone: [phone]   Fax:  [phone]  Physical Therapy Treatment  Patient Details  Name: Jody Dixon MRN: [id number] Date of Birth: 02/22/1956 Referring Provider: Melina Schools    Encounter Date: 07/01/2017  PT End of Session - 07/01/17 1115    PT Start Time  1102 No PT  patient wanted to go home to her Grandaughter,  family sickness    PT Stop Time  1109    PT Time Calculation (min)  7 min       Past Medical History:  Diagnosis Date  . Chronic constipation   . Common migraine with intractable migraine 10/21/2016  . Migraine   . Osteoporosis     Past Surgical History:  Procedure Laterality Date  . FRACTURE SURGERY Right    foot, crushing injury    There were no vitals filed for this visit.  Subjective Assessment - 07/01/17 1114    Subjective  The DN helped a lot.  i want them to do it on the other side.  I did not sleep well due tro grandaughter getting sick.  i wanted to cancel but i was afraid of getting a charge.  i want to go home.                                  PT Short Term Goals - 06/10/17 1204      PT SHORT TERM GOAL #1   Title  Patient to experience pain as being no more than 4/10 during all functional tasks and activities in order to improve QOL and task tolerance     Time  4    Period  Weeks    Status  New    Target Date  07/08/17      PT SHORT TERM GOAL #2   Title  Patient to be able to perform correct functional bed mobility and floor to overhead lifting with correct mechanics in order to prevent exacerbation of pain during daily tasks     Time  4    Period  Weeks    Status  New      PT SHORT TERM GOAL #3   Title  Patient to demonstrate improved gross joint mobility as evidenced by ability to tolerate at least grade III mobilizations without increased pain     Time  4    Period  Weeks    Status  New      PT  SHORT TERM GOAL #4   Title  Patient to be compliant with appropriate and progressive HEP, to be updated PRN     Time  2    Period  Weeks    Status  New    Target Date  06/24/17        PT Long Term Goals - 06/10/17 1220      PT LONG TERM GOAL #1   Title  Patient to demonstrate MMT improvement of at least 1 grade in all tested muscle groups in order to reduce pain and improve functional task tolerance     Time  8    Period  Weeks    Status  New    Target Date  08/05/17      PT LONG TERM GOAL #2   Title  Patient to experience pain as being no more than 2/10 in order to improve tolerance to  work based and functional tasks     Period  Weeks    Status  New      PT LONG TERM GOAL #3   Title  Patient to be able to tolerate at least 1/2 shift of work without increase in pain in order to improve functional task tolerance and QOL     Time  8    Period  Weeks    Status  New      PT LONG TERM GOAL #4   Title  Patient to report she has been able to tolerate at least 20 minutes of exercise at the gym at least 3 times per week in order to assist in return to PLOF     Time  [place] - 07/01/17 1117    Clinical Impression Statement  Patient arrived but wanted to go home.  Did not sleep well due to sick grandaughter.  Did not want to cancel due to fear of getting charged.  No PT today.      PT Next Visit Plan  conitnue moist heat, continue dry needling if beneficial and appropriate. Postural and mechanics training.     PT Home Exercise Plan  Eval: pelvic tilts, lumbar rotations, scap retractions     Consulted and Agree with Plan of Care  Patient       Patient will benefit from skilled therapeutic intervention in order to improve the following deficits and impairments:     Visit Diagnosis: Chronic midline low back pain without sciatica  Muscle weakness (generalized)  Abnormal posture     Problem List Patient Active Problem List    Diagnosis Date Noted  . Common migraine with intractable migraine 10/21/2016    Jody Dixon  PTA 07/01/2017, 11:18 AM  [place], [place], [address] Phone: [phone]   Fax:  [phone]  Name: Jody Dixon MRN: [id number] Date of Birth: [birth year]

## 2017-07-08 ENCOUNTER — Ambulatory Visit: Payer: 59 | Admitting: Physical Therapy

## 2017-07-10 ENCOUNTER — Ambulatory Visit: Payer: 59 | Admitting: Physical Therapy

## 2017-07-11 ENCOUNTER — Encounter

## 2017-07-15 ENCOUNTER — Encounter: Payer: Self-pay | Admitting: Physical Therapy

## 2017-07-15 ENCOUNTER — Ambulatory Visit: Payer: 59 | Attending: Orthopedic Surgery | Admitting: Physical Therapy

## 2017-07-15 DIAGNOSIS — M6281 Muscle weakness (generalized): Secondary | ICD-10-CM | POA: Diagnosis not present

## 2017-07-15 DIAGNOSIS — G8929 Other chronic pain: Secondary | ICD-10-CM | POA: Diagnosis not present

## 2017-07-15 DIAGNOSIS — R293 Abnormal posture: Secondary | ICD-10-CM | POA: Insufficient documentation

## 2017-07-15 DIAGNOSIS — M545 Low back pain: Secondary | ICD-10-CM | POA: Insufficient documentation

## 2017-07-15 NOTE — Therapy (Signed)
[place], [place], [address] Phone: [phone]   Fax:  [phone]  Physical Therapy Treatment (Re-Assessment)  Patient Details  Name: Jody Dixon MRN: [id number] Date of Birth: 1955-08-28 Referring Provider: Melina Schools    Encounter Date: 07/15/2017  PT End of Session - 07/15/17 1203    Visit Number  7    Number of Visits  17    Date for PT Re-Evaluation  08/10/17    Authorization Type  [employer]     Authorization Time Period  06/10/17 to 08/10/17    PT Start Time  1017    PT Stop Time  1050 moist heat not included in billing     PT Time Calculation (min)  33 min    Activity Tolerance  Patient tolerated treatment well    Behavior During Therapy  [facility] for tasks assessed/performed       Past Medical History:  Diagnosis Date  . Chronic constipation   . Common migraine with intractable migraine 10/21/2016  . Migraine   . Osteoporosis     Past Surgical History:  Procedure Laterality Date  . FRACTURE SURGERY Right    foot, crushing injury    There were no vitals filed for this visit.  Subjective Assessment - 07/15/17 1018    Subjective  The DN helped a lot, I'd like to do the other side. I tend to feel better but work can still flare me up sometimes. I felt better this week, the needling really helped.     Pertinent History  hx foot fracture, joint replacements in toes     How long can you sit comfortably?  5/7- "feels a little better", 15 minutes     How long can you stand comfortably?  5/7- 10-15 minutes but I have to shift     How long can you walk comfortably?  5/7- 10 minutes but then hips start to flare     Patient Stated Goals  reduce pain, get back to workout program, be comfortable while doing daily tasks     Currently in Pain?  Yes    Pain Score  5     Pain Location  Back    Pain Orientation  Left;Lower    Pain Descriptors / Indicators  Pressure;Discomfort;Throbbing    Pain Type   Chronic pain    Pain Radiating Towards  none     Pain Onset  More than a month ago    Pain Frequency  Constant    Aggravating Factors   being in any one position too long     Pain Relieving Factors  DN, heat     Effect of Pain on Daily Activities  moderate          [facility] PT Assessment - 07/15/17 0001      AROM   Lumbar Flexion  WFL     Lumbar Extension  mild limitations     Lumbar - Right Side Bend  [facility]     Lumbar - Left Side Bend  [facility]       Strength   Right/Left Hip  Left;Right    Right Hip Flexion  5/5    Right Hip Extension  3/5    Right Hip ABduction  5/5    Left Hip Flexion  5/5    Left Hip Extension  3/5    Left Hip ABduction  5/5    Right Knee Flexion  4+/5    Right  Knee Extension  5/5    Left Knee Flexion  4+/5    Left Knee Extension  5/5      Flexibility   Hamstrings  moderate tightness R, L mild tighness     Piriformis  WFL                    [facility] Adult PT Treatment/Exercise - 07/15/17 0001      Modalities   Modalities  Moist Heat      Moist Heat Therapy   Number Minutes Moist Heat  8 Minutes    Moist Heat Location  Lumbar Spine      Manual Therapy   Manual Therapy  Soft tissue mobilization    Manual therapy comments  separate from all other skiled services     Soft tissue mobilization  R QL ball assist              PT Education - 07/15/17 1202    Education provided  Yes    Education Details  progress with PT, POC moving forward, refering specific DN questions to DN clinician     Person(s) Educated  Patient    Methods  Explanation    Comprehension  Verbalized understanding       PT Short Term Goals - 07/15/17 1035      PT SHORT TERM GOAL #1   Title  Patient to experience pain as being no more than 4/10 during all functional tasks and activities in order to improve QOL and task tolerance     Baseline  5/7- 8/10 at worst but overall pain has been much better     Time  4    Period  Weeks    Status  On-going      PT  SHORT TERM GOAL #2   Title  Patient to be able to perform correct functional bed mobility and floor to overhead lifting with correct mechanics in order to prevent exacerbation of pain during daily tasks     Baseline  5/7- DNT, focus has been pain control     Time  4    Period  Weeks    Status  On-going      PT SHORT TERM GOAL #3   Title  Patient to demonstrate improved gross joint mobility as evidenced by ability to tolerate at least grade III mobilizations without increased pain     Baseline  5/7- DNT     Time  4    Period  Weeks    Status  On-going      PT SHORT TERM GOAL #4   Title  Patient to be compliant with appropriate and progressive HEP, to be updated PRN     Baseline  5/7- compliant     Time  2    Period  Weeks    Status  Achieved        PT Long Term Goals - 07/15/17 1037      PT LONG TERM GOAL #1   Title  Patient to demonstrate MMT improvement of at least 1 grade in all tested muscle groups in order to reduce pain and improve functional task tolerance     Baseline  5/7- great improvement, see flowsheet     Time  8    Period  Weeks    Status  Partially Met      PT LONG TERM GOAL #2   Title  Patient to experience pain as being no more than 2/10 in order  to improve tolerance to work based and functional tasks     Baseline  5/7- ongoing     Time  8    Period  Weeks    Status  New      PT LONG TERM GOAL #3   Title  Patient to be able to tolerate at least 1/2 shift of work without increase in pain in order to improve functional task tolerance and QOL     Baseline  5/7- work can still flare pain but has had some better shifts     Time  8    Period  Weeks    Status  On-going      PT LONG TERM GOAL #4   Title  Patient to report she has been able to tolerate at least 20 minutes of exercise at the gym at least 3 times per week in order to assist in return to PLOF     Baseline  5/7- ongoing     Time  8    Period  Weeks    Status  On-going            Plan -  07/15/17 1205    [place] performed today. Patient appears to be making slow progress towards all goals at this time, and reports and demonstrates functional improvements since dry needling was performed a couple of sessions ago. She remains motivated to continue with skilled PT services moving forward and is interested in return to cycling/spinning class. Applied moist heat (not included in billing) and otherwise performed STM to QL region today, plan to repeat DN next session with certified clinician. She will continue to benefit from skilled PT services to address pain, reduce functional limitations, and assist in return to optimal level of function.     Rehab Potential  Good    PT Frequency  2x / week    PT Duration  4 weeks    PT Treatment/Interventions  ADLs/Self Care Home Management;Cryotherapy;Electrical Stimulation;Moist Heat;Ultrasound;Gait training;Stair training;Functional mobility training;Therapeutic activities;Therapeutic exercise;Balance training;Neuromuscular re-education;Patient/family education;Manual techniques;Dry needling;Taping    PT Next Visit Plan  DN and progress to strength/return to exercise as pain becomes more controlled. Update HEP     PT Home Exercise Plan  Eval: pelvic tilts, lumbar rotations, scap retractions     Consulted and Agree with Plan of Care  Patient       Patient will benefit from skilled therapeutic intervention in order to improve the following deficits and impairments:  Improper body mechanics, Pain, Decreased mobility, Postural dysfunction, Hypomobility, Decreased strength, Decreased balance, Difficulty walking, Impaired flexibility  Visit Diagnosis: Chronic midline low back pain without sciatica  Muscle weakness (generalized)  Abnormal posture     Problem List Patient Active Problem List   Diagnosis Date Noted  . Common migraine with intractable migraine 10/21/2016    Deniece Ree PT, DPT,  CBIS  Supplemental Physical Therapist [place]   Pager [place] [facility] [address] [place], [place], [address] Phone: [phone]   Fax:  [phone]  Name: Jody Dixon MRN: [id number] Date of Birth: [birth year]

## 2017-07-21 ENCOUNTER — Encounter: Payer: Self-pay | Admitting: Physical Therapy

## 2017-07-21 ENCOUNTER — Ambulatory Visit: Payer: 59 | Admitting: Physical Therapy

## 2017-07-21 DIAGNOSIS — M6281 Muscle weakness (generalized): Secondary | ICD-10-CM

## 2017-07-21 DIAGNOSIS — M545 Low back pain: Principal | ICD-10-CM

## 2017-07-21 DIAGNOSIS — G8929 Other chronic pain: Secondary | ICD-10-CM

## 2017-07-21 DIAGNOSIS — R293 Abnormal posture: Secondary | ICD-10-CM

## 2017-07-21 NOTE — Therapy (Signed)
[place], [place], [address] Phone: [phone]   Fax:  [phone]  Physical Therapy Treatment  Patient Details  Name: Jody Dixon MRN: [id number] Date of Birth: [birth year] Referring Provider: Melina Schools    Encounter Date: 07/21/2017  PT End of Session - 07/21/17 1141    Visit Number  8    Number of Visits  17    Date for PT Re-Evaluation  08/10/17    Authorization Type  [employer]     PT Start Time  1146    PT Stop Time  1240    PT Time Calculation (min)  54 min    Activity Tolerance  Patient tolerated treatment well    Behavior During Therapy  [facility] for tasks assessed/performed       Past Medical History:  Diagnosis Date  . Chronic constipation   . Common migraine with intractable migraine 10/21/2016  . Migraine   . Osteoporosis     Past Surgical History:  Procedure Laterality Date  . FRACTURE SURGERY Right    foot, crushing injury    There were no vitals filed for this visit.  Subjective Assessment - 07/21/17 1141    Subjective  "My pain has gotten much, it tends to hurt more when I am laying on my back"    Patient Stated Goals  reduce pain, get back to workout program, be comfortable while doing daily tasks     Currently in Pain?  Yes    Pain Score  3     Pain Orientation  Left;Right;Lower    Pain Descriptors / Indicators  Aching    Pain Type  Chronic pain    Pain Onset  More than a month ago    Pain Frequency  Intermittent    Aggravating Factors   laying down                        [facility] Adult PT Treatment/Exercise - 07/21/17 1158      Self-Care   Self-Care  Posture    Posture  sleeping position with sleeping in supine with pillows beneath arms to prevent abnormal upper trap activation      Lumbar Exercises: Stretches   Lower Trunk Rotation Limitations  2 x 10 holding end ranges 5 sec     Quadruped Mid Back Stretch  2 reps;30 seconds childs pose  stretch      Lumbar Exercises: Aerobic   Nustep  L5 x 6 min UE/LE       Lumbar Exercises: Supine   Dead Bug  5 reps with 10 sec hold    Dead Bug Limitations  with sustained posterior pelvic tilt      Lumbar Exercises: Quadruped   [place] Horse  10 reps holding end ranges 2 sec      Moist Heat Therapy   Number Minutes Moist Heat  10 Minutes    Moist Heat Location  Lumbar Spine in supine      Manual Therapy   Manual Therapy  Taping    Manual therapy comments  skilled palpation and monitoring pt throughout TPDN     Joint Mobilization  Grade 3 PA L1-L5     Soft tissue mobilization  IASTM along bil lumbar paraspinals    Kinesiotex  Inhibit Muscle      Kinesiotix   Inhibit Muscle   bil paraspinals       Trigger Point Dry Needling - 07/21/17  1153    Consent Given?  Yes    Education Handout Provided  No    Muscles Treated Upper Body  Longissimus    Longissimus Response  Twitch response elicited;Palpable increased muscle length L5, L3, L1 bil            PT Education - 07/21/17 1237    Education provided  Yes    Education Details  reviewed DN during TPDN, sleeping posture/positioning.     Person(s) Educated  Patient    Methods  Explanation;Verbal cues    Comprehension  Verbalized understanding;Verbal cues required       PT Short Term Goals - 07/15/17 1035      PT SHORT TERM GOAL #1   Title  Patient to experience pain as being no more than 4/10 during all functional tasks and activities in order to improve QOL and task tolerance     Baseline  5/7- 8/10 at worst but overall pain has been much better     Time  4    Period  Weeks    Status  On-going      PT SHORT TERM GOAL #2   Title  Patient to be able to perform correct functional bed mobility and floor to overhead lifting with correct mechanics in order to prevent exacerbation of pain during daily tasks     Baseline  5/7- DNT, focus has been pain control     Time  4    Period  Weeks    Status  On-going      PT  SHORT TERM GOAL #3   Title  Patient to demonstrate improved gross joint mobility as evidenced by ability to tolerate at least grade III mobilizations without increased pain     Baseline  5/7- DNT     Time  4    Period  Weeks    Status  On-going      PT SHORT TERM GOAL #4   Title  Patient to be compliant with appropriate and progressive HEP, to be updated PRN     Baseline  5/7- compliant     Time  2    Period  Weeks    Status  Achieved        PT Long Term Goals - 07/15/17 1037      PT LONG TERM GOAL #1   Title  Patient to demonstrate MMT improvement of at least 1 grade in all tested muscle groups in order to reduce pain and improve functional task tolerance     Baseline  5/7- great improvement, see flowsheet     Time  8    Period  Weeks    Status  Partially Met      PT LONG TERM GOAL #2   Title  Patient to experience pain as being no more than 2/10 in order to improve tolerance to work based and functional tasks     Baseline  5/7- ongoing     Time  8    Period  Weeks    Status  New      PT LONG TERM GOAL #3   Title  Patient to be able to tolerate at least 1/2 shift of work without increase in pain in order to improve functional task tolerance and QOL     Baseline  5/7- work can still flare pain but has had some better shifts     Time  8    Period  Weeks    Status  On-going  PT LONG TERM GOAL #4   Title  Patient to report she has been able to tolerate at least 20 minutes of exercise at the gym at least 3 times per week in order to assist in return to PLOF     Baseline  5/7- ongoing     Time  8    Period  Weeks    Status  On-going            Plan - 07/21/17 1237    Clinical Impression Statement  Cotninued working on bil lumbar paraspinal tightness with TPDN followed with IASTM techniques and mobilizations. trialed KT taping for inhibition and core activation which she reported no increase in pain or soreness. educated on supine sleeping posture and benefits.  MHP end of session which she reported no increase in pain.     PT Next Visit Plan  DN and progress to strength/return to exercise as pain becomes more controlled. Update HEP core activation, posture/lifting mechanics    PT Home Exercise Plan  pelvic tilts, lumbar rotations, scap retractions, sleeping positioning.     Consulted and Agree with Plan of Care  Patient       Patient will benefit from skilled therapeutic intervention in order to improve the following deficits and impairments:  Improper body mechanics, Pain, Decreased mobility, Postural dysfunction, Hypomobility, Decreased strength, Decreased balance, Difficulty walking, Impaired flexibility  Visit Diagnosis: Chronic midline low back pain without sciatica  Muscle weakness (generalized)  Abnormal posture     Problem List Patient Active Problem List   Diagnosis Date Noted  . Common migraine with intractable migraine 10/21/2016   [place]arr [place] PT, DPT, LAT, ATC  07/21/17  12:41 PM      [facility] [address] [place], [place], [address] Phone: [phone]   Fax:  [phone]  Name: JOCELYNN GIOFFRE MRN: [id number] Date of Birth: Jul 08, 1955

## 2017-07-29 ENCOUNTER — Encounter: Payer: Self-pay | Admitting: Physical Therapy

## 2017-07-29 ENCOUNTER — Ambulatory Visit: Payer: 59 | Admitting: Physical Therapy

## 2017-07-29 DIAGNOSIS — M6281 Muscle weakness (generalized): Secondary | ICD-10-CM | POA: Diagnosis not present

## 2017-07-29 DIAGNOSIS — G8929 Other chronic pain: Secondary | ICD-10-CM

## 2017-07-29 DIAGNOSIS — R293 Abnormal posture: Secondary | ICD-10-CM | POA: Diagnosis not present

## 2017-07-29 DIAGNOSIS — M545 Low back pain: Secondary | ICD-10-CM | POA: Diagnosis not present

## 2017-07-29 NOTE — Therapy (Signed)
[place], [place], [address] Phone: [phone]   Fax:  [phone]  Physical Therapy Treatment  Patient Details  Name: Jody Dixon MRN: [id number] Date of Birth: 11/13/1955 Referring Provider: Melina Schools    Encounter Date: 07/29/2017  PT End of Session - 07/29/17 1411    Visit Number  9    Number of Visits  17    Date for PT Re-Evaluation  08/10/17    Authorization Type  [employer]     Authorization Time Period  06/10/17 to 08/10/17    PT Start Time  1345 moist heat not included in billing     PT Stop Time  1411    PT Time Calculation (min)  26 min    Activity Tolerance  Patient tolerated treatment well    Behavior During Therapy  [facility] for tasks assessed/performed       Past Medical History:  Diagnosis Date  . Chronic constipation   . Common migraine with intractable migraine 10/21/2016  . Migraine   . Osteoporosis     Past Surgical History:  Procedure Laterality Date  . FRACTURE SURGERY Right    foot, crushing injury    There were no vitals filed for this visit.  Subjective Assessment - 07/29/17 1332    Subjective  I had a lot of pain after last time that he needled, the next few days also hurt quite a bit but now it is feeling better. I might try riding my bike tonight or tomorrow.  Its not feeling bad right now. The dog is still slepeing in the bed and I am still getting into weird positions when I sleep.     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  Yes    Pain Score  3     Pain Location  Back    Pain Orientation  Lower;Right;Left    Pain Descriptors / Indicators  Pressure    Pain Type  Chronic pain    Pain Radiating Towards  none     Pain Onset  More than a month ago    Pain Frequency  Intermittent    Aggravating Factors    positioning, laying down     Pain Relieving Factors  heat but I don't use it as much any more, pain patch, being upright     Effect of Pain on Daily Activities  mild-moderate                        [facility] Adult PT Treatment/Exercise - 07/29/17 0001      Lumbar Exercises: Supine   Pelvic Tilt  15 reps    Clam  15 reps;3 seconds;Other (comment) red TB     Bridge  15 reps;Other (comment) posterior pelvic tilt       Lumbar Exercises: Sidelying   Hip Abduction  Both;10 reps      Lumbar Exercises: Prone   Straight Leg Raise  10 reps      Moist Heat Therapy   Number Minutes Moist Heat  10 Minutes not included in billing     Moist Heat Location  Lumbar Spine;Other (comment) and R lateral thigh       Manual Therapy   Soft tissue mobilization  attempted rolling pin  R lateral thigh but unable to tolerate              PT Education - 07/29/17 1411    Education provided  Yes    Education Details  POC moving forward, diff dx for pain with sidelying hip ABD, soft tissue restirctions R lateral thigh     Person(s) Educated  Patient    Methods  Explanation    Comprehension  Verbalized understanding;Need further instruction       PT Short Term Goals - 07/15/17 1035      PT SHORT TERM GOAL #1   Title  Patient to experience pain as being no more than 4/10 during all functional tasks and activities in order to improve QOL and task tolerance     Baseline  5/7- 8/10 at worst but overall pain has been much better     Time  4    Period  Weeks    Status  On-going      PT SHORT TERM GOAL #2   Title  Patient to be able to perform correct functional bed mobility and floor to overhead lifting with correct mechanics in order to prevent exacerbation of pain during daily tasks     Baseline  5/7- DNT, focus has been pain control     Time  4    Period  Weeks    Status  On-going      PT SHORT TERM GOAL #3   Title  Patient to demonstrate improved gross joint mobility as evidenced  by ability to tolerate at least grade III mobilizations without increased pain     Baseline  5/7- DNT     Time  4    Period  Weeks    Status  On-going      PT SHORT TERM GOAL #4   Title  Patient to be compliant with appropriate and progressive HEP, to be updated PRN     Baseline  5/7- compliant     Time  2    Period  Weeks    Status  Achieved        PT Long Term Goals - 07/15/17 1037      PT LONG TERM GOAL #1   Title  Patient to demonstrate MMT improvement of at least 1 grade in all tested muscle groups in order to reduce pain and improve functional task tolerance     Baseline  5/7- great improvement, see flowsheet     Time  8    Period  Weeks    Status  Partially Met      PT LONG TERM GOAL #2   Title  Patient to experience pain as being no more than 2/10 in order to improve tolerance to work based and functional tasks     Baseline  5/7- ongoing     Time  8    Period  Weeks    Status  New      PT LONG TERM GOAL #3   Title  Patient to be able to tolerate at least 1/2 shift of work without increase in pain in order to improve functional task tolerance and QOL     Baseline  5/7- work can still flare pain but has had some better shifts     Time  8    Period  Weeks    Status  On-going      PT LONG TERM GOAL #4   Title  Patient to report she has been able to  tolerate at least 20 minutes of exercise at the gym at least 3 times per week in order to assist in return to PLOF     Baseline  5/7- ongoing     Time  8    Period  Weeks    Status  On-going            Plan - 07/29/17 1412    Clinical Impression Statement  Began with moist heat pack to low back as this has been quite successful in managing pain thus far. Otherwise focused on functional stretching and strengthening techniques today, also encouraged patient to try exercise bike at home to assess pain before going back to full exercise class. Noted muscle soreness and knotting R lateral thigh with hip ABD exercise,  attempted STM to address but unable to tolerate, then applied moist heat for 3-4 minutes which relieved pain. Recommend possible early re-assessment within next 1-2 sessions as patient reports she is close to where she would like her functional status to be.     Rehab Potential  Good    PT Frequency  2x / week    PT Duration  4 weeks    PT Treatment/Interventions  ADLs/Self Care Home Management;Cryotherapy;Electrical Stimulation;Moist Heat;Ultrasound;Gait training;Stair training;Functional mobility training;Therapeutic activities;Therapeutic exercise;Balance training;Neuromuscular re-education;Patient/family education;Manual techniques;Dry needling;Taping    PT Next Visit Plan  DN and progress to strength/return to exercise as pain becomes more controlled. Update HEP core activation, posture/lifting mechanics    PT Home Exercise Plan  pelvic tilts, lumbar rotations, scap retractions, sleeping positioning.     Consulted and Agree with Plan of Care  Patient       Patient will benefit from skilled therapeutic intervention in order to improve the following deficits and impairments:  Improper body mechanics, Pain, Decreased mobility, Postural dysfunction, Hypomobility, Decreased strength, Decreased balance, Difficulty walking, Impaired flexibility  Visit Diagnosis: Chronic midline low back pain without sciatica  Muscle weakness (generalized)  Abnormal posture     Problem List Patient Active Problem List   Diagnosis Date Noted  . Common migraine with intractable migraine 10/21/2016    Deniece Ree PT, DPT, CBIS  Supplemental Physical Therapist [place]   Pager [place] [facility] [address] [place], [place], [address] Phone: [phone]   Fax:  [phone]  Name: Jody Dixon MRN: [id number] Date of Birth: 04-05-1955

## 2017-07-31 ENCOUNTER — Encounter: Payer: Self-pay | Admitting: Physical Therapy

## 2017-07-31 ENCOUNTER — Ambulatory Visit: Payer: 59 | Admitting: Physical Therapy

## 2017-07-31 DIAGNOSIS — G8929 Other chronic pain: Secondary | ICD-10-CM | POA: Diagnosis not present

## 2017-07-31 DIAGNOSIS — M6281 Muscle weakness (generalized): Secondary | ICD-10-CM | POA: Diagnosis not present

## 2017-07-31 DIAGNOSIS — M545 Low back pain: Secondary | ICD-10-CM | POA: Diagnosis not present

## 2017-07-31 DIAGNOSIS — R293 Abnormal posture: Secondary | ICD-10-CM | POA: Diagnosis not present

## 2017-07-31 NOTE — Therapy (Signed)
[place], [place], [address] Phone: [phone]   Fax:  [phone]  Physical Therapy Treatment  Patient Details  Name: Jody Dixon MRN: [id number] Date of Birth: 1955/10/10 Referring Provider: Melina Schools    Encounter Date: 07/31/2017  PT End of Session - 07/31/17 1240    Visit Number  10    Number of Visits  17    Date for PT Re-Evaluation  08/10/17    Authorization Type  [employer]     Authorization Time Period  06/10/17 to 08/10/17    PT Start Time  1016    PT Stop Time  1114    PT Time Calculation (min)  58 min    Activity Tolerance  Patient tolerated treatment well    Behavior During Therapy  [facility] for tasks assessed/performed       Past Medical History:  Diagnosis Date  . Chronic constipation   . Common migraine with intractable migraine 10/21/2016  . Migraine   . Osteoporosis     Past Surgical History:  Procedure Laterality Date  . FRACTURE SURGERY Right    foot, crushing injury    There were no vitals filed for this visit.  Subjective Assessment - 07/31/17 1021    Subjective  I had sharp pain from my tail bone down, radiating down my leg and I still have pain , I wish I could go back to spin class,    Pertinent History  hx foot fracture, joint replacements in toes     How long can you sit comfortably?  15 -20 min feels worse when i sit pain around 5/10    How long can you stand comfortably?  I need to shift  30 mnutes washing dishes and cooking    How long can you walk comfortably?  walk for 15 mnutes feels better when I walk my dog    Patient Stated Goals  reduce pain, get back to workout program, be comfortable while doing daily tasks     Currently in Pain?  Yes    Pain Score  5     Pain Location  Back    Pain Descriptors / Indicators  Sharp;Spasm    Pain Type  Chronic pain    Pain Onset  More than a month ago    Pain Frequency  Intermittent         [facility] PT Assessment  - 07/31/17 1035      Posture/Postural Control   Posture Comments  Pt with elevated left QL and tendernessover sI joint line                   [facility] Adult PT Treatment/Exercise - 07/31/17 1035      Lumbar Exercises: Stretches   Other Lumbar Stretch Exercise  standing left Quadratus lumbourm 30 sec stretch while holding on to door frame x 3 and then in right sidelying for 12 minutes with heat applied       Lumbar Exercises: Supine   Other Supine Lumbar Exercises  attempted McGill curl up but pulls on right SI joint.  tends to try to hyperextend neck and finally started chin tucking with 8 attempt.      Lumbar Exercises: Quadruped   Madcat/Old Horse  10 reps x 2    Opposite Arm/Leg Raise  15 reps requires constant cuing for correct execution      Moist Heat Therapy   Number Minutes Moist Heat  12 Minutes  Moist Heat Location  Lumbar Spine right side lyng  heat on left      Manual Therapy   Manual therapy comments  skilled palpation and monitoring pt throughout TPDN     Joint Mobilization  Grade 3 PA L1-L5 and SI joint line    Myofascial Release  left quadratus lumborum    Muscle Energy Technique  MET for left hamstring left SI       Trigger Point Dry Needling - 07/31/17 1235    Consent Given?  Yes    Education Handout Provided  No previously given    Muscles Treated Upper Body  Longissimus;Quadratus Lumborum left side only and around SI joint line , L-4 to Sacrum left    Longissimus Response  Twitch response elicited;Palpable increased muscle length           PT Education - 07/31/17 1043    Education provided  Yes    Education Details  explanation of findings.Marland Kitchen elevated left QL with sheering of SI joint line.  Pt added to HEP with cat camel, birddog and standing /sidelying QL stretch    Person(s) Educated  Patient    Methods  Explanation;Demonstration;Tactile cues;Verbal cues    Comprehension  Verbalized understanding;Returned demonstration       PT Short  Term Goals - 07/15/17 1035      PT SHORT TERM GOAL #1   Title  Patient to experience pain as being no more than 4/10 during all functional tasks and activities in order to improve QOL and task tolerance     Baseline  5/7- 8/10 at worst but overall pain has been much better     Time  4    Period  Weeks    Status  On-going      PT SHORT TERM GOAL #2   Title  Patient to be able to perform correct functional bed mobility and floor to overhead lifting with correct mechanics in order to prevent exacerbation of pain during daily tasks     Baseline  5/7- DNT, focus has been pain control     Time  4    Period  Weeks    Status  On-going      PT SHORT TERM GOAL #3   Title  Patient to demonstrate improved gross joint mobility as evidenced by ability to tolerate at least grade III mobilizations without increased pain     Baseline  5/7- DNT     Time  4    Period  Weeks    Status  On-going      PT SHORT TERM GOAL #4   Title  Patient to be compliant with appropriate and progressive HEP, to be updated PRN     Baseline  5/7- compliant     Time  2    Period  Weeks    Status  Achieved        PT Long Term Goals - 07/15/17 1037      PT LONG TERM GOAL #1   Title  Patient to demonstrate MMT improvement of at least 1 grade in all tested muscle groups in order to reduce pain and improve functional task tolerance     Baseline  5/7- great improvement, see flowsheet     Time  8    Period  Weeks    Status  Partially Met      PT LONG TERM GOAL #2   Title  Patient to experience pain as being no more than 2/10 in order  to improve tolerance to work based and functional tasks     Baseline  5/7- ongoing     Time  8    Period  Weeks    Status  New      PT LONG TERM GOAL #3   Title  Patient to be able to tolerate at least 1/2 shift of work without increase in pain in order to improve functional task tolerance and QOL     Baseline  5/7- work can still flare pain but has had some better shifts     Time   8    Period  Weeks    Status  On-going      PT LONG TERM GOAL #4   Title  Patient to report she has been able to tolerate at least 20 minutes of exercise at the gym at least 3 times per week in order to assist in return to PLOF     Baseline  5/7- ongoing     Time  8    Period  Weeks    Status  On-going            Plan - 07/31/17 1240    Clinical Impression Statement  Pt complained of 5/10 pain and her pain has been sharp since attempting left hip abduction last visit.  Pt points to left joint line, coccyx and left Quadratus Lumborum as source of pain.  Pt attempted McGIll 3 exercises.  Pt required constant cuing and when trying to do curl up tended to hyperextend neck rather than chin tucking and keeping neck relaxed.  Pt with incresed pain so ex eliminated. Pt then performed cat/camel and birddog exercises. Pt did say she felt better with movement. PT took opportunity to emphasize importance of movement and exercise for ongoing back pain and to move within pain free avaiblable range.  Pt did have left elevated pelvis and rotation and pain at SI joint due to sheering force.  on left .  Pt consented to TPDN and wwas closely monitored throughout process.  Pt also given some stretches . Will continue to try to assess goals next time and benefit of continuing PT    Rehab Potential  Good    PT Frequency  2x / week    PT Duration  4 weeks    PT Treatment/Interventions  ADLs/Self Care Home Management;Cryotherapy;Electrical Stimulation;Moist Heat;Ultrasound;Gait training;Stair training;Functional mobility training;Therapeutic activities;Therapeutic exercise;Balance training;Neuromuscular re-education;Patient/family education;Manual techniques;Dry needling;Taping    PT Next Visit Plan   asses goals and benefit of PT. DN and progress to strength/return to exercise as pain becomes more controlled. Update HEP core activation, posture/lifting mechanics    PT Home Exercise Plan  pelvic tilts, lumbar  rotations, scap retractions, sleeping positioning. cat/camel. bird dog, QL stretch in standing and in sidelying    Consulted and Agree with Plan of Care  Patient       Patient will benefit from skilled therapeutic intervention in order to improve the following deficits and impairments:  Improper body mechanics, Pain, Decreased mobility, Postural dysfunction, Hypomobility, Decreased strength, Decreased balance, Difficulty walking, Impaired flexibility  Visit Diagnosis: Chronic midline low back pain without sciatica  Muscle weakness (generalized)  Abnormal posture     Problem List Patient Active Problem List   Diagnosis Date Noted  . Common migraine with intractable migraine 10/21/2016    Voncille Lo, PT Certified Exercise Expert for the Aging Adult  07/31/17 12:49 PM Phone: [phone] Fax: [place]  [place], [place], [address] Phone: [phone]   Fax:  [phone]  Name: Jody Dixon MRN: [id number] Date of Birth: 1955/11/27

## 2017-07-31 NOTE — Patient Instructions (Signed)
      Voncille Lo, PT Certified Exercise Expert for the Aging Adult  07/31/17 10:43 AM Phone: [phone] Fax: [phone]

## 2017-08-05 ENCOUNTER — Ambulatory Visit: Payer: 59 | Admitting: Adult Health

## 2017-08-05 VITALS — BP 140/90 | HR 82 | Ht 67.0 in | Wt 151.0 lb

## 2017-08-05 DIAGNOSIS — G43009 Migraine without aura, not intractable, without status migrainosus: Secondary | ICD-10-CM

## 2017-08-05 DIAGNOSIS — R252 Cramp and spasm: Secondary | ICD-10-CM

## 2017-08-05 MED ORDER — TOPIRAMATE 100 MG PO TABS: 150.0000 mg | ORAL_TABLET | Freq: Every day | ORAL | 5 refills | Status: DC

## 2017-08-05 MED ORDER — CYCLOBENZAPRINE HCL 10 MG PO TABS
5.0000 mg | ORAL_TABLET | Freq: Every day | ORAL | 5 refills | Status: DC
Start: 2017-08-05 — End: 2018-09-07

## 2017-08-05 MED FILL — CYCLOBENZAPRINE 10 MG TAB: 10 | 30 days supply | Qty: 30 | Fill #0

## 2017-08-05 MED FILL — TOPIRAMATE 100 MG TABS: 100 | 90 days supply | Qty: 135 | Fill #0

## 2017-08-05 NOTE — Progress Notes (Signed)
PATIENT: Jody Dixon DOB: 09/30/1955  REASON FOR VISIT: follow up HISTORY FROM: patient  HISTORY OF PRESENT ILLNESS: Today 08/05/17 Jody Dixon is a 62 year old female with a history of migraine headaches.  She returns today for follow-up.  She is currently taking Topamax 100 mg at bedtime.  She reports that her headaches have been under relatively good control.  She has 2-3 headaches a month.  She states that she has a trigger to certain smells and eating chocolate.  She reports that she does have a headache currently.  Reports that it started yesterday.  Headache is located in the temporal regions bilaterally.  She does have some nausea as well as photophobia and phonophobia.  She denies any numbness or tingling or changes in her vision.  She reports that she took Maxalt yesterday but woke up and the headache was back this morning.  She also states that she is been having muscle cramps during the night.  She reports that it wakes her up in her sleep and she has to get up and ambulate in order for them to resolve.  She reports that she does drink water throughout the day.  She returns today for evaluation.  HISTORY 01/29/17 Jody Dixon is a 62 year old female with a history of migraine headaches.  She returns today for follow-up.  She is currently on Topamax 100 mg daily as well as Zomig nasal spray.  She reports that she has 3-4 migraines a month.  Her headaches typically occur across the forehead and sometimes they radiate down into the shoulders.  She states that on occasion she does have photophobia, phonophobia, nausea and vomiting.  She also reports a sensitivity to smell.  She denies any changes with her vision.  She reports on occasion she will use Flexeril when the headache moves to her shoulders.  She reports that Zomig was beneficial for the headache although the taste was unpleasant and the medication is expensive.  She would like to switch back to Maxalt.  She returns today for an  evaluation.   REVIEW OF SYSTEMS: Out of a complete 14 system review of symptoms, the patient complains only of the following symptoms, and all other reviewed systems are negative.  Back pain, headache  ALLERGIES: No Known Allergies  HOME MEDICATIONS: Outpatient Medications Prior to Visit  Medication Sig Dispense Refill  . ALOE VERA PO Take 1 capsule by mouth daily.    . cyclobenzaprine (FLEXERIL) 10 MG tablet TAKE 1 TABLET BY MOUTH DAILY AS NEEDED FOR MUSCLE SPASMS AND HEADACHES 30 tablet 0  . denosumab (PROLIA) 60 MG/ML SOLN injection Inject 60 mg into the skin every 6 (six) months. Administer in upper arm, thigh, or abdomen    . diclofenac (VOLTAREN) 75 MG EC tablet Take 75 mg by mouth.   3  . diclofenac sodium (VOLTAREN) 1 % GEL   6  . hydroxychloroquine (PLAQUENIL) 200 MG tablet Take 200 mg by mouth 2 (two) times daily.   3  . lidocaine (LIDODERM) 5 % Place 1 patch onto the skin as needed.    Marland Kitchen PRESCRIPTION MEDICATION Inject into the muscle once. Dexamethasone and lidocaine trigger spot injection    . rizatriptan (MAXALT) 10 MG tablet Take one tablet at the onset of migraine  -  May repeat in 2 hours if needed 10 tablet 11  . topiramate (TOPAMAX) 100 MG tablet TAKE 1 TABLET (100 MG TOTAL) BY MOUTH AT BEDTIME. 90 tablet 1   No facility-administered medications prior  to visit.     PAST MEDICAL HISTORY: Past Medical History:  Diagnosis Date  . Chronic constipation   . Common migraine with intractable migraine 10/21/2016  . Migraine   . Osteoporosis     PAST SURGICAL HISTORY: Past Surgical History:  Procedure Laterality Date  . FRACTURE SURGERY Right    foot, crushing injury    FAMILY HISTORY: No family history on file.  SOCIAL HISTORY: Social History   Socioeconomic History  . Marital status: Divorced    Spouse name: Not on file  . Number of children: Not on file  . Years of education: Not on file  . Highest education level: Not on file  Occupational History  .  Not on file  Social Needs  . Financial resource strain: Not on file  . Food insecurity:    Worry: Not on file    Inability: Not on file  . Transportation needs:    Medical: Not on file    Non-medical: Not on file  Tobacco Use  . Smoking status: Never Smoker  . Smokeless tobacco: Never Used  Substance and Sexual Activity  . Alcohol use: No  . Drug use: No  . Sexual activity: Never  Lifestyle  . Physical activity:    Days per week: Not on file    Minutes per session: Not on file  . Stress: Not on file  Relationships  . Social connections:    Talks on phone: Not on file    Gets together: Not on file    Attends religious service: Not on file    Active member of club or organization: Not on file    Attends meetings of clubs or organizations: Not on file    Relationship status: Not on file  . Intimate partner violence:    Fear of current or ex partner: Not on file    Emotionally abused: Not on file    Physically abused: Not on file    Forced sexual activity: Not on file  Other Topics Concern  . Not on file  Social History Narrative  . Not on file      PHYSICAL EXAM  Vitals:   08/05/17 1036  BP: 140/90  Pulse: 82  Weight: 151 lb (68.5 kg)  Height: 5\' 7"  (1.702 m)   Body mass index is 23.65 kg/m.  Generalized: Well developed, in no acute distress   Neurological examination  Mentation: Alert oriented to time, place, history taking. Follows all commands speech and language fluent Cranial nerve II-XII: Pupils were equal round reactive to light. Extraocular movements were full, visual field were full on confrontational test. Facial sensation and strength were normal. Uvula tongue midline. Head turning and shoulder shrug  were normal and symmetric. Motor: The motor testing reveals 5 over 5 strength of all 4 extremities. Good symmetric motor tone is noted throughout.  Sensory: Sensory testing is intact to soft touch on all 4 extremities. No evidence of extinction is  noted.  Coordination: Cerebellar testing reveals good finger-nose-finger and heel-to-shin bilaterally.  Gait and station: Gait is normal. Tandem gait is normal. Romberg is negative. No drift is seen.  Reflexes: Deep tendon reflexes are symmetric and normal bilaterally.   DIAGNOSTIC DATA (LABS, IMAGING, TESTING) - I reviewed patient records, labs, notes, testing and imaging myself where available.    ASSESSMENT AND PLAN 62 y.o. year old female  has a past medical history of Chronic constipation, Common migraine with intractable migraine (10/21/2016), Migraine, and Osteoporosis. here with:  1.  Migraine headache 2.  Nocturnal leg cramps  The patient would like to increase her Topamax to see if she can get better benefit with her headaches.  We will increase to 150 mg at bedtime.  She will continue using Maxalt for acute treatment.  Advised that she can try taking tizanidine 5 to 10 mg at bedtime to see if this prevents muscle cramps.  I will also check some blood work looking for reversible causes of her leg cramps.  Advised that if her symptoms worsen or she develops new symptoms she should let us know.  Patient does note that she had CBC and CMP with her rheumatologist 1 month ago and blood work was unremarkable.    Ward Givens, MSN, NP-C 08/05/2017, 10:53 AM [facility] [address], [place], [place] [address] [phone]

## 2017-08-05 NOTE — Patient Instructions (Signed)
Your Plan:  Increase Topamax to 150 mg at bedtime Continue Maxalt Take Tizanidine 5 mg at bedtime to see if this prevents cramps If your symptoms worsen or you develop new symptoms please let us know.   Thank you for coming to see [place] at [facility]. I hope we have been able to provide you high quality care today.  You may receive a patient satisfaction survey over the next few weeks. We would appreciate your feedback and comments so that we may continue to improve ourselves and the health of our patients.

## 2017-08-05 NOTE — Progress Notes (Signed)
I have read the note, and I agree with the clinical assessment and plan.  Rashunda Passon K Deaja Rizo   

## 2017-08-06 ENCOUNTER — Telehealth: Payer: Self-pay | Admitting: *Deleted

## 2017-08-06 ENCOUNTER — Ambulatory Visit: Payer: 59 | Admitting: Physical Therapy

## 2017-08-06 LAB — CK TOTAL AND CKMB (NOT AT ARMC)
CK MB INDEX: 2.8 ng/mL (ref 0.0–5.3)
CK TOTAL: 121 U/L (ref 24–173)

## 2017-08-06 LAB — MAGNESIUM: MAGNESIUM: 2.3 mg/dL (ref 1.6–2.3)

## 2017-08-06 NOTE — Telephone Encounter (Signed)
Spoke with patient and informed her that her labs are normal. She verbalized understanding, appreciation. 

## 2017-08-07 ENCOUNTER — Encounter: Payer: Self-pay | Admitting: Physical Therapy

## 2017-08-07 ENCOUNTER — Ambulatory Visit: Payer: 59 | Admitting: Physical Therapy

## 2017-08-07 DIAGNOSIS — M6281 Muscle weakness (generalized): Secondary | ICD-10-CM

## 2017-08-07 DIAGNOSIS — G8929 Other chronic pain: Secondary | ICD-10-CM

## 2017-08-07 DIAGNOSIS — R293 Abnormal posture: Secondary | ICD-10-CM | POA: Diagnosis not present

## 2017-08-07 DIAGNOSIS — M545 Low back pain: Principal | ICD-10-CM

## 2017-08-07 MED FILL — LIDOCAINE PATCH 5%: 5 | 30 days supply | Qty: 30 | Fill #0

## 2017-08-07 NOTE — Patient Instructions (Addendum)
Strengthening: Hip Extension (Prone)    Press pelvis into pillow under stomach. Tighten muscles on front of left thigh, then lift leg __6__ inches from surface, keeping knee locked. Repeat _10___ times per set. Do __2__ sets per session. Do __1__ sessions per day.  http://orth.exer.us/621   Copyright  VHI. All rights reserved.   Bridging    Slowly raise buttocks from floor, keeping stomach tight. Repeat __15 x 2_ times per set. Do __1__ sets per session. Do __1__ sessions per day.  Then do bridging with ball squeeze 15 x2 bridging for adductor muscle engagement http://orth.exer.us/1097   Copyright  VHI. All rights reserved.   Voncille Lo, PT Certified Exercise Expert for the Aging Adult  08/07/17 1:53 PM Phone: [phone] Fax: [phone]

## 2017-08-07 NOTE — Therapy (Addendum)
[place], [place], [address] Phone: [phone]   Fax:  [phone]  Physical Therapy Treatment/Discharge Note  Patient Details  Name: Jody Dixon MRN: [id number] Date of Birth: 21-Sep-1955 Referring Provider: Melina Schools    Encounter Date: 08/07/2017  PT End of Session - 08/07/17 1432    Visit Number  11    Number of Visits  17    Date for PT Re-Evaluation  08/10/17    Authorization Type  [employer]     Authorization Time Period  06/10/17 to 08/10/17    PT Start Time  1331    PT Stop Time  1432    PT Time Calculation (min)  61 min    Activity Tolerance  Patient tolerated treatment well    Behavior During Therapy  [facility] for tasks assessed/performed       Past Medical History:  Diagnosis Date  . Chronic constipation   . Common migraine with intractable migraine 10/21/2016  . Migraine   . Osteoporosis     Past Surgical History:  Procedure Laterality Date  . FRACTURE SURGERY Right    foot, crushing injury    There were no vitals filed for this visit.  Subjective Assessment - 08/07/17 1333    Subjective  My pain is only a 2/10 when I hyperextend my back and when I do the birddog exercise.  My right foot is painful and my dog waslying on it. I had an injury to right 2nd and 3rd toe with a jt replacement done in [place]    Pertinent History  hx foot fracture, joint replacements in toes     Patient Stated Goals  reduce pain, get back to workout program, be comfortable while doing daily tasks     Currently in Pain?  Yes    Pain Score  2  increases to 5/10 with back hyperextesion    Pain Orientation  Lower;Right;Left    Pain Descriptors / Indicators  Spasm    Pain Type  Chronic pain    Pain Onset  More than a month ago    Pain Frequency  Intermittent                       [facility] Adult PT Treatment/Exercise - 08/07/17 1342      Lumbar Exercises: Stretches   Quadruped Mid Back  Stretch  2 reps;30 seconds      Lumbar Exercises: Supine   Bridge  Other (comment);10 reps posterior pelvic tilt     Bridge with [employer]  10 reps with post pelvic tilt      Lumbar Exercises: Sidelying   Hip Abduction  Both;10 reps      Lumbar Exercises: Prone   Straight Leg Raise  10 reps    Straight Leg Raises Limitations  pillow under stomach, prone pelvic press with SLR prone 10 x 2      Lumbar Exercises: Quadruped   [place]/Old Horse  10 reps x 2 feet hanging off mat due to 2nd,3rd toe injury    Straight Leg Raise  15 reps    Opposite Arm/Leg Raise  5 reps pt hyperextends low back rather than maintaining neutral      Moist Heat Therapy   Number Minutes Moist Heat  12 Minutes    Moist Heat Location  Lumbar Spine right side lyng  heat on left      Manual Therapy   Manual therapy comments  skilled palpation and monitoring pt throughout TPDN     Joint Mobilization  Grade 3 PA L1-L5 and SI joint line    Myofascial Release  left quadratus lumborum    Muscle Energy Technique  MET for left hamstring left SI resisted       Trigger Point Dry Needling - 08/07/17 1401    Consent Given?  Yes    Education Handout Provided  No previously given    Muscles Treated Upper Body  Longissimus;Quadratus Lumborum;Gluteus minimus;Piriformis bilside only SI joint line  QL on left    Muscles Treated Lower Body  Piriformis;Gluteus maximus    Longissimus Response  Twitch response elicited;Palpable increased muscle length    Gluteus Maximus Response  Twitch response elicited;Palpable increased muscle length right side only    Piriformis Response  Twitch response elicited;Palpable increased muscle length right side only           PT Education - 08/07/17 1354    Education provided  Yes    Education Details  added prone with pillow hip extension, bridging and bridging with ball squeeze to HEP    Person(s) Educated  Patient    Methods  Explanation;Demonstration;Tactile cues;Verbal cues     Comprehension  Verbalized understanding;Returned demonstration       PT Short Term Goals - 08/07/17 1355      PT SHORT TERM GOAL #1   Title  Patient to experience pain as being no more than 4/10 during all functional tasks and activities in order to improve QOL and task tolerance     Baseline  Pain today is 2/10 but with exercise  with back extension is  5/10    Time  4    Period  Weeks    Status  On-going      PT SHORT TERM GOAL #2   Title  Patient to be able to perform correct functional bed mobility and floor to overhead lifting with correct mechanics in order to prevent exacerbation of pain during daily tasks     Baseline  5/30  pain control  reduced to 2/10 after TPDN    Time  4    Period  Weeks    Status  On-going      PT SHORT TERM GOAL #3   Title  Patient to demonstrate improved gross joint mobility as evidenced by ability to tolerate at least grade III mobilizations without increased pain     Baseline  pain 5/7 on 08-07-17 on palpation    Time  4    Period  Weeks    Status  On-going      PT SHORT TERM GOAL #4   Title  Patient to be compliant with appropriate and progressive HEP, to be updated PRN     Baseline  updating HEP today pt compliant with HEP    Time  2    Period  Weeks    Status  Achieved        PT Long Term Goals - 07/15/17 1037      PT LONG TERM GOAL #1   Title  Patient to demonstrate MMT improvement of at least 1 grade in all tested muscle groups in order to reduce pain and improve functional task tolerance     Baseline  5/7- great improvement, see flowsheet     Time  8    Period  Weeks    Status  Partially Met      PT LONG TERM GOAL #2   Title  Patient to experience pain as being no more than 2/10 in order to improve tolerance to work based and functional tasks     Baseline  5/7- ongoing     Time  8    Period  Weeks    Status  New      PT LONG TERM GOAL #3   Title  Patient to be able to tolerate at least 1/2 shift of work without increase in  pain in order to improve functional task tolerance and QOL     Baseline  5/7- work can still flare pain but has had some better shifts     Time  8    Period  Weeks    Status  On-going      PT LONG TERM GOAL #4   Title  Patient to report she has been able to tolerate at least 20 minutes of exercise at the gym at least 3 times per week in order to assist in return to PLOF     Baseline  5/7- ongoing     Time  8    Period  Weeks    Status  On-going            Plan - 08/07/17 1424    Clinical Impression Statement  Pt initially came to clinic limping due to right 2nd/ 3rd toe pain.  Pt did not have incident. ( thought dog lying on it. 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 Pt reports 2/10 pain ( better after last RX)  with palpation and hyperextension of back up to 5/10.  Pt tends to anteriorly tilt pelvis.  ( tightened Psoas)  Pt added to HEP for modified exercises and sacral stabilization.  Pt consented to TPDN and was closely monitored throughout session. Pt will need re evaluation next visit.    Rehab Potential  Good    PT Frequency  2x / week    PT Treatment/Interventions  ADLs/Self Care Home Management;Cryotherapy;Electrical Stimulation;Moist Heat;Ultrasound;Gait training;Stair training;Functional mobility training;Therapeutic activities;Therapeutic exercise;Balance training;Neuromuscular re-education;Patient/family education;Manual techniques;Dry needling;Taping    PT Next Visit Plan   asses goals and benefit of PT. DN and progress to strength/return to exercise as pain becomes more controlled. Update HEP core activation, posture/lifting mechanics    PT Home Exercise Plan  pelvic tilts, lumbar rotations, scap retractions, sleeping positioning. cat/camel. bird dog, QL stretch in standing and in sidelying bridging, prone leg SLRwith pelvic press    Consulted and Agree with Plan of Care  Patient       Patient will benefit from skilled therapeutic intervention in  order to improve the following deficits and impairments:  Improper body mechanics, Pain, Decreased mobility, Postural dysfunction, Hypomobility, Decreased strength, Decreased balance, Difficulty walking, Impaired flexibility  Visit Diagnosis: Chronic midline low back pain without sciatica  Muscle weakness (generalized)  Abnormal posture     Problem List Patient Active Problem List   Diagnosis Date Noted  . Common migraine with intractable migraine 10/21/2016    Voncille Lo, PT Certified Exercise Expert for the Aging Adult  08/07/17 2:33 PM Phone: [phone] Fax: [facility] [address] [place], [place], [address] Phone: [phone]   Fax:  [phone]  Name: Jody Dixon MRN: [id number] Date of Birth: [birth year]  PHYSICAL THERAPY DISCHARGE SUMMARY  Visits from Start of Care: 11  Current functional level related to goals / functional outcomes: unknown   Remaining deficits: unknown   Education / Equipment: Initial  HEP Plan:                                                    Patient goals were not met. Patient is being discharged due to not returning since the last visit.  ?????    Voncille Lo, PT Certified Exercise Expert for the Aging Adult  09/09/17 6:31 PM Phone: [phone] Fax: [phone]

## 2017-08-11 ENCOUNTER — Telehealth: Payer: Self-pay | Admitting: Physical Therapy

## 2017-08-11 ENCOUNTER — Ambulatory Visit: Payer: 59 | Admitting: Physical Therapy

## 2017-08-11 DIAGNOSIS — S92514A Nondisplaced fracture of proximal phalanx of right lesser toe(s), initial encounter for closed fracture: Secondary | ICD-10-CM | POA: Diagnosis not present

## 2017-08-11 NOTE — Telephone Encounter (Signed)
No-show. Called and [facility] regarding today's missed visit as well as time/date of next scheduled session.  Deniece Ree PT, DPT, CBIS  Supplemental Physical Therapist [facility]   Pager [phone]

## 2017-08-13 ENCOUNTER — Ambulatory Visit: Payer: Self-pay | Admitting: Physical Therapy

## 2017-08-18 ENCOUNTER — Encounter: Payer: 59 | Admitting: Physical Therapy

## 2017-08-20 ENCOUNTER — Encounter: Payer: 59 | Admitting: Physical Therapy

## 2017-09-03 DIAGNOSIS — S92514A Nondisplaced fracture of proximal phalanx of right lesser toe(s), initial encounter for closed fracture: Secondary | ICD-10-CM | POA: Diagnosis not present

## 2017-09-08 ENCOUNTER — Encounter: Payer: Self-pay | Admitting: Physical Therapy

## 2017-09-08 NOTE — Therapy (Signed)
[place], [place], [address] Phone: [phone]   Fax:  [phone]  Patient Details  Name: Jody Dixon MRN: [id number] Date of Birth: 28-Dec-1955 Referring Provider:  No ref. provider found  Encounter Date: 09/08/2017  PHYSICAL THERAPY DISCHARGE SUMMARY  Visits from Start of Care: 11  Current functional level related to goals / functional outcomes: Has not returned since last scheduled session- DC today    Remaining deficits: Unable to assess    Education / Equipment: N/A  Plan: Patient agrees to discharge.  Patient goals were not met. Patient is being discharged due to not returning since the last visit.  ?????      Deniece Ree PT, DPT, CBIS  Supplemental Physical Therapist [place]   Pager [facility] [address] [place], [place], [address] Phone: [phone]   Fax:  [phone]

## 2017-09-24 DIAGNOSIS — S92514A Nondisplaced fracture of proximal phalanx of right lesser toe(s), initial encounter for closed fracture: Secondary | ICD-10-CM | POA: Diagnosis not present

## 2017-09-24 DIAGNOSIS — M79671 Pain in right foot: Secondary | ICD-10-CM | POA: Diagnosis not present

## 2017-10-17 ENCOUNTER — Other Ambulatory Visit: Payer: Self-pay | Admitting: Family Medicine

## 2017-10-17 DIAGNOSIS — Z1231 Encounter for screening mammogram for malignant neoplasm of breast: Secondary | ICD-10-CM

## 2017-10-21 ENCOUNTER — Ambulatory Visit
Admission: RE | Admit: 2017-10-21 | Discharge: 2017-10-21 | Disposition: A | Discharge status: Home / Self Care | Payer: 59 | Source: Ambulatory Visit | Attending: Family Medicine | Admitting: Family Medicine

## 2017-10-21 DIAGNOSIS — Z1231 Encounter for screening mammogram for malignant neoplasm of breast: Secondary | ICD-10-CM

## 2017-10-22 DIAGNOSIS — S92514A Nondisplaced fracture of proximal phalanx of right lesser toe(s), initial encounter for closed fracture: Secondary | ICD-10-CM | POA: Diagnosis not present

## 2017-10-22 DIAGNOSIS — M79671 Pain in right foot: Secondary | ICD-10-CM | POA: Diagnosis not present

## 2017-10-29 DIAGNOSIS — M810 Age-related osteoporosis without current pathological fracture: Secondary | ICD-10-CM | POA: Diagnosis not present

## 2017-11-18 MED FILL — RIZATRIPTAN BENZOATE 10 MG: 10 | 30 days supply | Qty: 10 | Fill #3

## 2017-11-20 DIAGNOSIS — M2041 Other hammer toe(s) (acquired), right foot: Secondary | ICD-10-CM | POA: Diagnosis not present

## 2017-11-20 DIAGNOSIS — S92514A Nondisplaced fracture of proximal phalanx of right lesser toe(s), initial encounter for closed fracture: Secondary | ICD-10-CM | POA: Diagnosis not present

## 2017-11-20 DIAGNOSIS — M79671 Pain in right foot: Secondary | ICD-10-CM | POA: Diagnosis not present

## 2018-01-05 DIAGNOSIS — Z6821 Body mass index (BMI) 21.0-21.9, adult: Secondary | ICD-10-CM | POA: Diagnosis not present

## 2018-01-05 DIAGNOSIS — M2550 Pain in unspecified joint: Secondary | ICD-10-CM | POA: Diagnosis not present

## 2018-01-05 DIAGNOSIS — Z79899 Other long term (current) drug therapy: Secondary | ICD-10-CM | POA: Diagnosis not present

## 2018-01-05 DIAGNOSIS — M150 Primary generalized (osteo)arthritis: Secondary | ICD-10-CM | POA: Diagnosis not present

## 2018-01-05 DIAGNOSIS — M810 Age-related osteoporosis without current pathological fracture: Secondary | ICD-10-CM | POA: Diagnosis not present

## 2018-01-05 DIAGNOSIS — R768 Other specified abnormal immunological findings in serum: Secondary | ICD-10-CM | POA: Diagnosis not present

## 2018-01-05 MED FILL — HYDROXYCHLOROQUINE SULFATE: 200 | 90 days supply | Qty: 180 | Fill #0

## 2018-01-05 MED FILL — NAPROXEN 500 MG TABLET: 500 | 90 days supply | Qty: 180 | Fill #0

## 2018-01-26 DIAGNOSIS — E538 Deficiency of other specified B group vitamins: Secondary | ICD-10-CM | POA: Diagnosis not present

## 2018-01-26 DIAGNOSIS — Z0000 Encounter for general adult medical examination without abnormal findings: Secondary | ICD-10-CM | POA: Diagnosis not present

## 2018-01-26 DIAGNOSIS — Z1211 Encounter for screening for malignant neoplasm of colon: Secondary | ICD-10-CM | POA: Diagnosis not present

## 2018-01-26 DIAGNOSIS — I10 Essential (primary) hypertension: Secondary | ICD-10-CM | POA: Diagnosis not present

## 2018-01-26 DIAGNOSIS — R5382 Chronic fatigue, unspecified: Secondary | ICD-10-CM | POA: Diagnosis not present

## 2018-01-26 DIAGNOSIS — E782 Mixed hyperlipidemia: Secondary | ICD-10-CM | POA: Diagnosis not present

## 2018-01-26 MED FILL — OMEPRAZOLE 20 MG CPDR: 20 | 30 days supply | Qty: 30 | Fill #0

## 2018-01-27 MED FILL — RIZATRIPTAN BENZOATE 10 MG: 10 | 30 days supply | Qty: 10 | Fill #4

## 2018-02-03 ENCOUNTER — Other Ambulatory Visit: Payer: Self-pay | Admitting: Family Medicine

## 2018-02-03 DIAGNOSIS — E2839 Other primary ovarian failure: Secondary | ICD-10-CM

## 2018-02-04 ENCOUNTER — Ambulatory Visit
Admission: RE | Admit: 2018-02-04 | Discharge: 2018-02-04 | Disposition: A | Discharge status: Home / Self Care | Payer: 59 | Source: Ambulatory Visit | Attending: Family Medicine | Admitting: Family Medicine

## 2018-02-04 DIAGNOSIS — M8588 Other specified disorders of bone density and structure, other site: Secondary | ICD-10-CM | POA: Diagnosis not present

## 2018-02-04 DIAGNOSIS — E2839 Other primary ovarian failure: Secondary | ICD-10-CM

## 2018-02-04 DIAGNOSIS — Z780 Asymptomatic menopausal state: Secondary | ICD-10-CM | POA: Diagnosis not present

## 2018-02-04 DIAGNOSIS — M810 Age-related osteoporosis without current pathological fracture: Secondary | ICD-10-CM | POA: Diagnosis not present

## 2018-02-18 DIAGNOSIS — Z86010 Personal history of colonic polyps: Secondary | ICD-10-CM | POA: Diagnosis not present

## 2018-02-18 DIAGNOSIS — K219 Gastro-esophageal reflux disease without esophagitis: Secondary | ICD-10-CM | POA: Diagnosis not present

## 2018-02-18 DIAGNOSIS — R142 Eructation: Secondary | ICD-10-CM | POA: Diagnosis not present

## 2018-03-02 DIAGNOSIS — H903 Sensorineural hearing loss, bilateral: Secondary | ICD-10-CM | POA: Diagnosis not present

## 2018-03-02 DIAGNOSIS — Z822 Family history of deafness and hearing loss: Secondary | ICD-10-CM | POA: Diagnosis not present

## 2018-03-06 MED FILL — DICLOFENAC SODIUM 1 % GEL: 1 | 90 days supply | Qty: 300 | Fill #1

## 2018-03-06 MED FILL — CYCLOBENZAPRINE HCL 10 MG T: 10 | 30 days supply | Qty: 30 | Fill #1

## 2018-03-06 MED FILL — TOPIRAMATE 100 MG TABLET: 100 | 90 days supply | Qty: 135 | Fill #2

## 2018-04-07 DIAGNOSIS — Z79899 Other long term (current) drug therapy: Secondary | ICD-10-CM | POA: Diagnosis not present

## 2018-04-07 DIAGNOSIS — R768 Other specified abnormal immunological findings in serum: Secondary | ICD-10-CM | POA: Diagnosis not present

## 2018-04-07 DIAGNOSIS — M2550 Pain in unspecified joint: Secondary | ICD-10-CM | POA: Diagnosis not present

## 2018-04-07 DIAGNOSIS — M150 Primary generalized (osteo)arthritis: Secondary | ICD-10-CM | POA: Diagnosis not present

## 2018-04-07 DIAGNOSIS — Z6822 Body mass index (BMI) 22.0-22.9, adult: Secondary | ICD-10-CM | POA: Diagnosis not present

## 2018-04-07 DIAGNOSIS — M810 Age-related osteoporosis without current pathological fracture: Secondary | ICD-10-CM | POA: Diagnosis not present

## 2018-04-08 MED FILL — NAPROXEN 500 MG TABLET: 500 | 90 days supply | Qty: 180 | Fill #1

## 2018-04-08 MED FILL — OMEPRAZOLE 20 MG CPDR: 20 | 30 days supply | Qty: 30 | Fill #1

## 2018-05-04 DIAGNOSIS — M810 Age-related osteoporosis without current pathological fracture: Secondary | ICD-10-CM | POA: Diagnosis not present

## 2018-05-07 DIAGNOSIS — K219 Gastro-esophageal reflux disease without esophagitis: Secondary | ICD-10-CM | POA: Diagnosis not present

## 2018-05-07 DIAGNOSIS — R110 Nausea: Secondary | ICD-10-CM | POA: Diagnosis not present

## 2018-05-07 MED FILL — PANTOPRAZOLE SOD DR 40 MG T: 40 | 30 days supply | Qty: 30 | Fill #0

## 2018-05-07 MED FILL — ONDANSETRON HCL 4 MG TABLET: 4 | 2 days supply | Qty: 10 | Fill #0

## 2018-05-29 MED FILL — TOPIRAMATE 100 MG TABLET: 100 | 90 days supply | Qty: 135 | Fill #3

## 2018-06-30 MED FILL — HYDROXYCHLOROQUINE SULFATE: 200 | 30 days supply | Qty: 60 | Fill #1

## 2018-06-30 MED FILL — PANTOPRAZOLE SOD DR 40 MG T: 40 | 30 days supply | Qty: 30 | Fill #0

## 2018-06-30 MED FILL — NAPROXEN 500 MG TABLET: 500 | 90 days supply | Qty: 180 | Fill #0

## 2018-07-09 ENCOUNTER — Telehealth: Payer: Self-pay

## 2018-07-09 NOTE — Telephone Encounter (Signed)
Unable to get in contact with the patient to convert her office visit with Megan on 07/15/2018 to a doxy.me visit. I left a voicemail asking her to return my call. Office number was provided.

## 2018-07-10 NOTE — Telephone Encounter (Signed)
Pt returned call and stated she would be more comfortable doing call through [employer] and wants to know if that's an option

## 2018-07-13 NOTE — Telephone Encounter (Signed)
Spoke with the patient and explained that we are not using [employer]. I explained how doxy.me visit works. She has given verbal consent to do a doxy.me visit with Megan on 07/15/2018 @ 2 PM. E-mail, mobile number and carrier have been confirmed and sent.

## 2018-07-14 NOTE — Progress Notes (Signed)
PATIENT: Jody Dixon DOB: Nov 09, 1955  REASON FOR VISIT: follow up HISTORY FROM: patient  Virtual Visit via Video Note  I connected with Jody Dixon on 07/15/18 at  2:00 PM EDT by a video enabled telemedicine application located remotely at [facility] and  verified that I am speaking with the correct person using two identifiers who was located at their own home.   I discussed the limitations of evaluation and management by telemedicine and the availability of in person appointments. The patient expressed understanding and agreed to proceed.   PATIENT: Jody Dixon DOB: 12-16-1955  REASON FOR VISIT: follow up HISTORY FROM: patient  HISTORY OF PRESENT ILLNESS: Today 07/14/18:  Jody Dixon is a 63 year old female with a history of migraine headaches.  She returns for follow-up.  She states that her headaches are slightly worse.  She states that she has been under a lot of stress at work and feels that this has contributed to her headaches.  She now had a headache "a few times a week."  Her headaches continue to occur in the temporal regions bilaterally.  Denies nausea and vomiting.  She does have photophobia and phonophobia.  She states that her headaches can last anywhere from 2 hours to all day.  She uses extra strength Tylenol and sometimes Maxalt.  She also has Flexeril but does not use it consistently.  She remains on Topamax 150 mg at bedtime.  Reports that she is tolerating this well.  HISTORY  08/05/17 Jody Dixon is a 63 year old female with a history of migraine headaches.  She returns today for follow-up.  She is currently taking Topamax 100 mg at bedtime.  She reports that her headaches have been under relatively good control.  She has 2-3 headaches a month.  She states that she has a trigger to certain smells and eating chocolate.  She reports that she does have a headache currently.  Reports that it started yesterday.  Headache is located in the  temporal regions bilaterally.  She does have some nausea as well as photophobia and phonophobia.  She denies any numbness or tingling or changes in her vision.  She reports that she took Maxalt yesterday but woke up and the headache was back this morning.  She also states that she is been having muscle cramps during the night.  She reports that it wakes her up in her sleep and she has to get up and ambulate in order for them to resolve.  She reports that she does drink water throughout the day.  She returns today for evaluation.  REVIEW OF SYSTEMS: Out of a complete 14 system review of symptoms, the patient complains only of the following symptoms, and all other reviewed systems are negative.  See HPI  ALLERGIES: No Known Allergies  HOME MEDICATIONS: Outpatient Medications Prior to Visit  Medication Sig Dispense Refill  . ALOE VERA PO Take 1 capsule by mouth daily.    . cyclobenzaprine (FLEXERIL) 10 MG tablet Take 0.5-1 tablets (5-10 mg total) by mouth at bedtime. 30 tablet 5  . denosumab (PROLIA) 60 MG/ML SOLN injection Inject 60 mg into the skin every 6 (six) months. Administer in upper arm, thigh, or abdomen    . diclofenac (VOLTAREN) 75 MG EC tablet Take 75 mg by mouth.   3  . diclofenac sodium (VOLTAREN) 1 % GEL   6  . hydroxychloroquine (PLAQUENIL) 200 MG tablet Take 200 mg by mouth 2 (two) times daily.  3  . lidocaine (LIDODERM) 5 % Place 1 patch onto the skin as needed.    Marland Kitchen PRESCRIPTION MEDICATION Inject into the muscle once. Dexamethasone and lidocaine trigger spot injection    . rizatriptan (MAXALT) 10 MG tablet Take one tablet at the onset of migraine  -  May repeat in 2 hours if needed 10 tablet 11  . topiramate (TOPAMAX) 100 MG tablet Take 1.5 tablets (150 mg total) by mouth at bedtime. 135 tablet 5   No facility-administered medications prior to visit.     PAST MEDICAL HISTORY: Past Medical History:  Diagnosis Date  . Chronic constipation   . Common migraine with  intractable migraine 10/21/2016  . Migraine   . Osteoporosis     PAST SURGICAL HISTORY: Past Surgical History:  Procedure Laterality Date  . FRACTURE SURGERY Right    foot, crushing injury    FAMILY HISTORY: No family history on file.  SOCIAL HISTORY: Social History   Socioeconomic History  . Marital status: Divorced    Spouse name: Not on file  . Number of children: Not on file  . Years of education: Not on file  . Highest education level: Not on file  Occupational History  . Not on file  Social Needs  . Financial resource strain: Not on file  . Food insecurity:    Worry: Not on file    Inability: Not on file  . Transportation needs:    Medical: Not on file    Non-medical: Not on file  Tobacco Use  . Smoking status: Never Smoker  . Smokeless tobacco: Never Used  Substance and Sexual Activity  . Alcohol use: No  . Drug use: No  . Sexual activity: Never  Lifestyle  . Physical activity:    Days per week: Not on file    Minutes per session: Not on file  . Stress: Not on file  Relationships  . Social connections:    Talks on phone: Not on file    Gets together: Not on file    Attends religious service: Not on file    Active member of club or organization: Not on file    Attends meetings of clubs or organizations: Not on file    Relationship status: Not on file  . Intimate partner violence:    Fear of current or ex partner: Not on file    Emotionally abused: Not on file    Physically abused: Not on file    Forced sexual activity: Not on file  Other Topics Concern  . Not on file  Social History Narrative  . Not on file      PHYSICAL EXAM   Generalized: Well developed, in no acute distress   Neurological examination  Mentation: Alert oriented to time, place, history taking. Follows all commands speech and language fluent Cranial nerve II-XII:  Extraocular movements were full, visual field were full on confrontational test. Facial symmetry noted. uvula  tongue midline. Head turning and shoulder shrug  were normal and symmetric. Motor: Good strength throughout subjectively per patient.  Sensory: Sensory testing is intact to soft touch on all 4 extremities subjectively per patient Coordination: Cerebellar testing reveals good finger-nose-finger  Gait and station: Gait is normal.  Reflexes: UTA  DIAGNOSTIC DATA (LABS, IMAGING, TESTING) - I reviewed patient records, labs, notes, testing and imaging myself where available.     ASSESSMENT AND PLAN 63 y.o. year old female  has a past medical history of Chronic constipation, Common migraine with intractable  migraine (10/21/2016), Migraine, and Osteoporosis. here with:  1.  Migraine headaches  The patient will increase Topamax to 200 mg at bedtime.  She will remain on Flexeril daily as needed.  I have encouraged the patient to take Maxalt at the onset of a migraine and repeat in 2 hours if needed.  She voiced understanding.  If her headache frequency or severity does not improve she will let us know.  She will follow-up in 6 months or sooner if needed.  I spent 15 minutes with the patient discussing symptoms, completing exam and discussing plan of care.   Ward Givens, MSN, NP-C 07/14/2018, 3:30 PM [facility] [address] [place], [place] [address] [phone]

## 2018-07-15 ENCOUNTER — Encounter: Payer: Self-pay | Admitting: Adult Health

## 2018-07-15 ENCOUNTER — Ambulatory Visit (INDEPENDENT_AMBULATORY_CARE_PROVIDER_SITE_OTHER): Payer: 59 | Admitting: Adult Health

## 2018-07-15 ENCOUNTER — Other Ambulatory Visit: Payer: Self-pay

## 2018-07-15 DIAGNOSIS — G43019 Migraine without aura, intractable, without status migrainosus: Secondary | ICD-10-CM | POA: Diagnosis not present

## 2018-07-15 MED ORDER — RIZATRIPTAN BENZOATE 10 MG PO TABS: ORAL_TABLET | ORAL | 11 refills | Status: DC

## 2018-07-15 MED ORDER — TOPIRAMATE 100 MG PO TABS: 200.0000 mg | ORAL_TABLET | Freq: Every day | ORAL | 3 refills | Status: DC

## 2018-07-15 MED FILL — RIZATRIPTAN BENZOATE 10 MG: 10 | 30 days supply | Qty: 10 | Fill #0

## 2018-07-21 DIAGNOSIS — K5901 Slow transit constipation: Secondary | ICD-10-CM | POA: Diagnosis not present

## 2018-07-21 DIAGNOSIS — K219 Gastro-esophageal reflux disease without esophagitis: Secondary | ICD-10-CM | POA: Diagnosis not present

## 2018-07-29 ENCOUNTER — Telehealth: Payer: Self-pay

## 2018-07-29 NOTE — Telephone Encounter (Signed)
Follow up has been scheduled. Patient is aware of appt day and time.   

## 2018-08-04 DIAGNOSIS — H524 Presbyopia: Secondary | ICD-10-CM | POA: Diagnosis not present

## 2018-09-03 DIAGNOSIS — Z79899 Other long term (current) drug therapy: Secondary | ICD-10-CM | POA: Diagnosis not present

## 2018-09-07 ENCOUNTER — Other Ambulatory Visit: Payer: Self-pay | Admitting: Adult Health

## 2018-09-07 MED FILL — CYCLOBENZAPRINE HCL 10 MG T: 10 | 30 days supply | Qty: 30 | Fill #0

## 2018-09-07 MED FILL — RIZATRIPTAN BENZOATE 10 MG: 10 | 30 days supply | Qty: 10 | Fill #1

## 2018-09-07 MED FILL — HYDROXYCHLOROQUINE SULFATE: 200 | 30 days supply | Qty: 60 | Fill #2

## 2018-09-07 MED FILL — DICLOFENAC SODIUM 1 % GEL: 1 | 90 days supply | Qty: 300 | Fill #0

## 2018-09-07 MED FILL — TOPIRAMATE 100 MG TABLET: 100 | 90 days supply | Qty: 180 | Fill #0

## 2018-09-07 MED FILL — PANTOPRAZOLE SOD DR 40 MG T: 40 | 30 days supply | Qty: 30 | Fill #1

## 2018-09-16 DIAGNOSIS — D225 Melanocytic nevi of trunk: Secondary | ICD-10-CM | POA: Diagnosis not present

## 2018-09-16 DIAGNOSIS — L570 Actinic keratosis: Secondary | ICD-10-CM | POA: Diagnosis not present

## 2018-09-16 DIAGNOSIS — D2272 Melanocytic nevi of left lower limb, including hip: Secondary | ICD-10-CM | POA: Diagnosis not present

## 2018-09-16 DIAGNOSIS — D2362 Other benign neoplasm of skin of left upper limb, including shoulder: Secondary | ICD-10-CM | POA: Diagnosis not present

## 2018-09-16 DIAGNOSIS — L259 Unspecified contact dermatitis, unspecified cause: Secondary | ICD-10-CM | POA: Diagnosis not present

## 2018-09-16 DIAGNOSIS — L821 Other seborrheic keratosis: Secondary | ICD-10-CM | POA: Diagnosis not present

## 2018-09-16 DIAGNOSIS — D2371 Other benign neoplasm of skin of right lower limb, including hip: Secondary | ICD-10-CM | POA: Diagnosis not present

## 2018-09-16 MED FILL — DESONIDE 0.05% CREAM: 0.05 | 14 days supply | Qty: 15 | Fill #0

## 2018-09-17 MED FILL — NAPROXEN 500 MG TABLET: 500 | 90 days supply | Qty: 180 | Fill #1

## 2018-10-06 MED FILL — CYCLOBENZAPRINE HCL 10 MG T: 10 | 30 days supply | Qty: 30 | Fill #1

## 2018-10-06 MED FILL — PANTOPRAZOLE SOD DR 40 MG T: 40 | 30 days supply | Qty: 30 | Fill #2

## 2018-10-06 MED FILL — HYDROXYCHLOROQUINE 200 MG T: 200 | 30 days supply | Qty: 60 | Fill #3

## 2018-10-06 MED FILL — RIZATRIPTAN BENZOATE 10 MG: 10 | 30 days supply | Qty: 10 | Fill #2

## 2018-10-08 DIAGNOSIS — Z79899 Other long term (current) drug therapy: Secondary | ICD-10-CM | POA: Diagnosis not present

## 2018-10-08 DIAGNOSIS — R768 Other specified abnormal immunological findings in serum: Secondary | ICD-10-CM | POA: Diagnosis not present

## 2018-10-08 DIAGNOSIS — M150 Primary generalized (osteo)arthritis: Secondary | ICD-10-CM | POA: Diagnosis not present

## 2018-10-08 DIAGNOSIS — M810 Age-related osteoporosis without current pathological fracture: Secondary | ICD-10-CM | POA: Diagnosis not present

## 2018-10-08 DIAGNOSIS — M2550 Pain in unspecified joint: Secondary | ICD-10-CM | POA: Diagnosis not present

## 2018-10-08 MED FILL — DULoxetine HCL 30 MG CPEP: 30 | 30 days supply | Qty: 30 | Fill #0

## 2018-10-15 DIAGNOSIS — K5901 Slow transit constipation: Secondary | ICD-10-CM | POA: Diagnosis not present

## 2018-10-15 DIAGNOSIS — K219 Gastro-esophageal reflux disease without esophagitis: Secondary | ICD-10-CM | POA: Diagnosis not present

## 2018-10-15 MED FILL — AMOXICILLIN 500 MG CAPSULE: 500 | 7 days supply | Qty: 21 | Fill #0

## 2018-11-03 DIAGNOSIS — M810 Age-related osteoporosis without current pathological fracture: Secondary | ICD-10-CM | POA: Diagnosis not present

## 2018-11-03 MED FILL — DULoxetine HCL 30 MG CPEP: 30 | 90 days supply | Qty: 90 | Fill #0

## 2018-11-03 MED FILL — HYDROXYCHLOROQUINE 200 MG T: 200 | 90 days supply | Qty: 180 | Fill #0

## 2018-11-04 MED FILL — PANTOPRAZOLE SOD DR 40 MG T: 40 | 30 days supply | Qty: 30 | Fill #3

## 2018-11-04 MED FILL — RIZATRIPTAN BENZOATE 10 MG: 10 | 30 days supply | Qty: 10 | Fill #3

## 2019-01-18 MED FILL — TOPIRAMATE 100 MG TABLET: 100 | 90 days supply | Qty: 180 | Fill #1

## 2019-01-20 ENCOUNTER — Ambulatory Visit: Payer: Self-pay | Admitting: Adult Health

## 2019-02-16 MED FILL — NAPROXEN 500 MG TABS: 500 | 90 days supply | Qty: 180 | Fill #0

## 2019-02-16 MED FILL — DULoxetine HCL 30 MG CPEP: 30 | 90 days supply | Qty: 90 | Fill #0

## 2019-02-16 MED FILL — CYCLOBENZAPRINE HCL 10 MG T: 10 | 30 days supply | Qty: 30 | Fill #2

## 2019-02-16 MED FILL — HYDROXYCHLOROQUINE 200 MG T: 200 | 90 days supply | Qty: 180 | Fill #1

## 2019-02-16 MED FILL — RIZATRIPTAN BENZOATE 10 MG: 10 | 18 days supply | Qty: 10 | Fill #4

## 2019-03-08 MED FILL — PANTOPRAZOLE SOD DR 40 MG T: 40 | 90 days supply | Qty: 90 | Fill #0

## 2019-05-13 MED FILL — CYCLOBENZAPRINE HCL 10 MG T: 10 | 30 days supply | Qty: 30 | Fill #3

## 2019-05-13 MED FILL — RIZATRIPTAN BENZOATE 10 MG: 10 | 18 days supply | Qty: 10 | Fill #5

## 2019-08-20 ENCOUNTER — Other Ambulatory Visit: Payer: Self-pay | Admitting: Adult Health

## 2019-11-02 ENCOUNTER — Telehealth: Payer: Self-pay | Admitting: Adult Health

## 2019-11-02 MED ORDER — TOPIRAMATE 100 MG PO TABS: 200.0000 mg | ORAL_TABLET | Freq: Every day | ORAL | 3 refills | Status: DC

## 2019-11-02 NOTE — Telephone Encounter (Signed)
Pt is requesting a refill for topiramate (TOPAMAX) 100 MG tablet .  Pharmacy: topiramate (TOPAMAX) 100 MG tablet

## 2019-11-02 NOTE — Addendum Note (Signed)
Addended by: Andre Lefort on: 11/02/2019 04:09 PM   Modules accepted: Orders

## 2019-12-29 ENCOUNTER — Ambulatory Visit: Payer: PRIVATE HEALTH INSURANCE | Admitting: Family Medicine

## 2019-12-29 ENCOUNTER — Encounter: Payer: Self-pay | Admitting: Family Medicine

## 2019-12-29 VITALS — BP 145/87 | HR 71 | Ht 66.0 in | Wt 146.0 lb

## 2019-12-29 DIAGNOSIS — F418 Other specified anxiety disorders: Secondary | ICD-10-CM | POA: Diagnosis not present

## 2019-12-29 DIAGNOSIS — G43019 Migraine without aura, intractable, without status migrainosus: Secondary | ICD-10-CM

## 2019-12-29 DIAGNOSIS — R519 Headache, unspecified: Secondary | ICD-10-CM | POA: Diagnosis not present

## 2019-12-29 MED ORDER — DULOXETINE HCL 60 MG PO CPEP: 60.0000 mg | ORAL_CAPSULE | Freq: Every day | ORAL | 3 refills | Status: DC

## 2019-12-29 NOTE — Patient Instructions (Addendum)
Below is our plan:  We will continue topiramate 200mg  at bedtime, rizatriptan and cyclobenzaprine as needed. We will increase duloxetine to 60mg  every day. Consider sleep workup. I have included some info below on sleep apnea.   Please make sure you are staying well hydrated. I recommend 50-60 ounces daily. Well balanced diet and regular exercise encouraged.    Please continue follow up with care team as directed.    Follow up with [place] in 6 months   You may receive a survey regarding today's visit. I encourage you to leave honest feed back as I do use this information to improve patient care. Thank you for seeing me today!     Sleep Apnea Sleep apnea affects breathing during sleep. It causes breathing to stop for a short time or to become shallow. It can also increase the risk of:  Heart attack.  Stroke.  Being very overweight (obese).  Diabetes.  Heart failure.  Irregular heartbeat. The goal of treatment is to help you breathe normally again. What are the causes? There are three kinds of sleep apnea:  Obstructive sleep apnea. This is caused by a blocked or collapsed airway.  Central sleep apnea. This happens when the brain does not send the right signals to the muscles that control breathing.  Mixed sleep apnea. This is a combination of obstructive and central sleep apnea. The most common cause of this condition is a collapsed or blocked airway. This can happen if:  Your throat muscles are too relaxed.  Your tongue and tonsils are too large.  You are overweight.  Your airway is too small. What increases the risk?  Being overweight.  Smoking.  Having a small airway.  Being older.  Being female.  Drinking alcohol.  Taking medicines to calm yourself (sedatives or tranquilizers).  Having family members with the condition. What are the signs or symptoms?  Trouble staying asleep.  Being sleepy or tired during the day.  Getting angry a lot.  Loud  snoring.  Headaches in the morning.  Not being able to focus your mind (concentrate).  Forgetting things.  Less interest in sex.  Mood swings.  Personality changes.  Feelings of sadness (depression).  Waking up a lot during the night to pee (urinate).  Dry mouth.  Sore throat. How is this diagnosed?  Your medical history.  A physical exam.  A test that is done when you are sleeping (sleep study). The test is most often done in a sleep lab but may also be done at home. How is this treated?   Sleeping on your side.  Using a medicine to get rid of mucus in your nose (decongestant).  Avoiding the use of alcohol, medicines to help you relax, or certain pain medicines (narcotics).  Losing weight, if needed.  Changing your diet.  Not smoking.  Using a machine to open your airway while you sleep, such as: ? An oral appliance. This is a mouthpiece that shifts your lower jaw forward. ? A CPAP device. This device blows air through a mask when you breathe out (exhale). ? An EPAP device. This has valves that you put in each nostril. ? A BPAP device. This device blows air through a mask when you breathe in (inhale) and breathe out.  Having surgery if other treatments do not work. It is important to get treatment for sleep apnea. Without treatment, it can lead to:  High blood pressure.  Coronary artery disease.  In men, not being able to have  an erection (impotence).  Reduced thinking ability. Follow these instructions at home: Lifestyle  Make changes that your doctor recommends.  Eat a healthy diet.  Lose weight if needed.  Avoid alcohol, medicines to help you relax, and some pain medicines.  Do not use any products that contain nicotine or tobacco, such as cigarettes, e-cigarettes, and chewing tobacco. If you need help quitting, ask your doctor. General instructions  Take over-the-counter and prescription medicines only as told by your doctor.  If you  were given a machine to use while you sleep, use it only as told by your doctor.  If you are having surgery, make sure to tell your doctor you have sleep apnea. You may need to bring your device with you.  Keep all follow-up visits as told by your doctor. This is important. Contact a doctor if:  The machine that you were given to use during sleep bothers you or does not seem to be working.  You do not get better.  You get worse. Get help right away if:  Your chest hurts.  You have trouble breathing in enough air.  You have an uncomfortable feeling in your back, arms, or stomach.  You have trouble talking.  One side of your body feels weak.  A part of your face is hanging down. These symptoms may be an emergency. Do not wait to see if the symptoms will go away. Get medical help right away. Call your local emergency services (911 in the [place] Do not drive yourself to the hospital. Summary  This condition affects breathing during sleep.  The most common cause is a collapsed or blocked airway.  The goal of treatment is to help you breathe normally while you sleep. This information is not intended to replace advice given to you by your health care provider. Make sure you discuss any questions you have with your health care provider. Document Revised: 12/12/2017 Document Reviewed: 10/21/2017 Elsevier Patient Education  [place].    Migraine Headache A migraine headache is a very strong throbbing pain on one side or both sides of your head. This type of headache can also cause other symptoms. It can last from 4 hours to 3 days. Talk with your doctor about what things may bring on (trigger) this condition. What are the causes? The exact cause of this condition is not known. This condition may be triggered or caused by:  Drinking alcohol.  Smoking.  Taking medicines, such as: ? Medicine used to treat chest pain (nitroglycerin). ? Birth control  pills. ? Estrogen. ? Some blood pressure medicines.  Eating or drinking certain products.  Doing physical activity. Other things that may trigger a migraine headache include:  Having a menstrual period.  Pregnancy.  Hunger.  Stress.  Not getting enough sleep or getting too much sleep.  Weather changes.  Tiredness (fatigue). What increases the risk?  Being 43-25 years old.  Being female.  Having a family history of migraine headaches.  Being Caucasian.  Having depression or anxiety.  Being very overweight. What are the signs or symptoms?  A throbbing pain. This pain may: ? Happen in any area of the head, such as on one side or both sides. ? Make it hard to do daily activities. ? Get worse with physical activity. ? Get worse around bright lights or loud noises.  Other symptoms may include: ? Feeling sick to your stomach (nauseous). ? Vomiting. ? Dizziness. ? Being sensitive to bright lights, loud noises, or smells.  Before you get a migraine headache, you may get warning signs (an aura). An aura may include: ? Seeing flashing lights or having blind spots. ? Seeing bright spots, halos, or zigzag lines. ? Having tunnel vision or blurred vision. ? Having numbness or a tingling feeling. ? Having trouble talking. ? Having weak muscles.  Some people have symptoms after a migraine headache (postdromal phase), such as: ? Tiredness. ? Trouble thinking (concentrating). How is this treated?  Taking medicines that: ? Relieve pain. ? Relieve the feeling of being sick to your stomach. ? Prevent migraine headaches.  Treatment may also include: ? Having acupuncture. ? Avoiding foods that bring on migraine headaches. ? Learning ways to control your body functions (biofeedback). ? Therapy to help you know and deal with negative thoughts (cognitive behavioral therapy). Follow these instructions at home: Medicines  Take over-the-counter and prescription medicines  only as told by your doctor.  Ask your doctor if the medicine prescribed to you: ? Requires you to avoid driving or using heavy machinery. ? Can cause trouble pooping (constipation). You may need to take these steps to prevent or treat trouble pooping:  Drink enough fluid to keep your pee (urine) pale yellow.  Take over-the-counter or prescription medicines.  Eat foods that are high in fiber. These include beans, whole grains, and fresh fruits and vegetables.  Limit foods that are high in fat and sugar. These include fried or sweet foods. Lifestyle  Do not drink alcohol.  Do not use any products that contain nicotine or tobacco, such as cigarettes, e-cigarettes, and chewing tobacco. If you need help quitting, ask your doctor.  Get at least 8 hours of sleep every night.  Limit and deal with stress. General instructions      Keep a journal to find out what may bring on your migraine headaches. For example, write down: ? What you eat and drink. ? How much sleep you get. ? Any change in what you eat or drink. ? Any change in your medicines.  If you have a migraine headache: ? Avoid things that make your symptoms worse, such as bright lights. ? It may help to lie down in a dark, quiet room. ? Do not drive or use heavy machinery. ? Ask your doctor what activities are safe for you.  Keep all follow-up visits as told by your doctor. This is important. Contact a doctor if:  You get a migraine headache that is different or worse than others you have had.  You have more than 15 headache days in one month. Get help right away if:  Your migraine headache gets very bad.  Your migraine headache lasts longer than 72 hours.  You have a fever.  You have a stiff neck.  You have trouble seeing.  Your muscles feel weak or like you cannot control them.  You start to lose your balance a lot.  You start to have trouble walking.  You pass out (faint).  You have a  seizure. Summary  A migraine headache is a very strong throbbing pain on one side or both sides of your head. These headaches can also cause other symptoms.  This condition may be treated with medicines and changes to your lifestyle.  Keep a journal to find out what may bring on your migraine headaches.  Contact a doctor if you get a migraine headache that is different or worse than others you have had.  Contact your doctor if you have more than 15 headache days  in a month. This information is not intended to replace advice given to you by your health care provider. Make sure you discuss any questions you have with your health care provider. Document Revised: 06/19/2018 Document Reviewed: 04/09/2018 Elsevier Patient Education  [place].

## 2019-12-29 NOTE — Progress Notes (Signed)
Chief Complaint  Patient presents with  . Follow-up    rm 16  . Migraine     HISTORY OF PRESENT ILLNESS: Today 12/29/19  Jody Dixon is a 64 y.o. female here today for follow up for migraines. She continues topiramate 200mg  at bedtime. She reports improvement in frequency and intensity, however, she does continues to have frequent morning headaches. She takes Tylenol regularly and headache usually resolves about 1 hour after waking. She has family history of sleep apnea. She does snore. She wakes with dry mouth. She does not drink much water. She may have 1-2 migraines each month. Rizatriptan and Flexeril as needed.  She lost her mother and her son this year. Mother battled lung cancer.  Son passed away at age 16 following a seizure resulting in brain bleed. She was started on duloxetine 30mg  that has seemed to help. Mother had sleep apnea. Two cousins are using CPAP. 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    HISTORY (copied from [facility] note on 07/15/2018)  Jody Dixon is a 64 year old female with a history of migraine headaches.  She returns for follow-up.  She states that her headaches are slightly worse.  She states that she has been under a lot of stress at work and feels that this has contributed to her headaches.  She now had a headache "a few times a week."  Her headaches continue to occur in the temporal regions bilaterally.  Denies nausea and vomiting.  She does have photophobia and phonophobia.  She states that her headaches can last anywhere from 2 hours to all day.  She uses extra strength Tylenol and sometimes Maxalt.  She also has Flexeril but does not use it consistently.  She remains on Topamax 150 mg at bedtime.  Reports that she is tolerating this well.  HISTORY 08/05/17 Ms. Perezis a 64 year old female with a history of migraine headaches. She returns today for follow-up. She is currently taking Topamax 100 mg at bedtime.She reports that her  headaches have been under relatively good control. She has 2-3 headaches a month. She states that she has a trigger to certain smells and eating chocolate. She reports that she does have a headache currently. Reports that it started yesterday. Headache is located in the temporal regions bilaterally. She does have some nausea as well as photophobia and phonophobia. She denies any numbness or tingling or changes in her vision. She reports that she took Maxalt yesterday but woke up and the headache was back this morning. She also states that she is been having muscle cramps during the night. She reports that it wakes her up in her sleep and she has to get up and ambulate in order for them to resolve. She reports that she does drink water throughout the day. She returns today for evaluation.    REVIEW OF SYSTEMS: Out of a complete 14 system review of symptoms, the patient complains only of the following symptoms, headaches, increased stress, muscle tension and all other reviewed systems are negative.   ALLERGIES: No Known Allergies   HOME MEDICATIONS: Outpatient Medications Prior to Visit  Medication Sig Dispense Refill  . ALOE VERA PO Take 1 capsule by mouth daily.    . cyclobenzaprine (FLEXERIL) 10 MG tablet TAKE 1/2 TO 1 TABLET BY MOUTH AT BEDTIME 30 tablet 5  . diclofenac sodium (VOLTAREN) 1 % GEL   6  . hydroxychloroquine (PLAQUENIL) 200 MG tablet Take 200 mg by mouth  2 (two) times daily.   3  . lidocaine (LIDODERM) 5 % Place 1 patch onto the skin as needed.    . naproxen (NAPROSYN) 500 MG tablet Take 500 mg by mouth 2 (two) times daily with a meal.    . pantoprazole (PROTONIX) 40 MG tablet Take 40 mg by mouth daily.    Marland Kitchen PRESCRIPTION MEDICATION Inject into the muscle once. Dexamethasone and lidocaine trigger spot injection    . rizatriptan (MAXALT) 10 MG tablet Take one tablet at the onset of migraine  -  May repeat in 2 hours if needed. Not to exceed 2 tabs in 24 hours. 10 tablet  11  . topiramate (TOPAMAX) 100 MG tablet Take 2 tablets (200 mg total) by mouth at bedtime. 180 tablet 3  . DULoxetine (CYMBALTA) 30 MG capsule Take 30 mg by mouth daily.    Marland Kitchen denosumab (PROLIA) 60 MG/ML SOLN injection Inject 60 mg into the skin every 6 (six) months. Administer in upper arm, thigh, or abdomen    . diclofenac (VOLTAREN) 75 MG EC tablet Take 75 mg by mouth.   3   No facility-administered medications prior to visit.     PAST MEDICAL HISTORY: Past Medical History:  Diagnosis Date  . Chronic constipation   . Common migraine with intractable migraine 10/21/2016  . Migraine   . Osteoporosis      PAST SURGICAL HISTORY: Past Surgical History:  Procedure Laterality Date  . FRACTURE SURGERY Right    foot, crushing injury     FAMILY HISTORY: History reviewed. No pertinent family history.   SOCIAL HISTORY: Social History   Socioeconomic History  . Marital status: Divorced    Spouse name: Not on file  . Number of children: Not on file  . Years of education: Not on file  . Highest education level: Not on file  Occupational History  . Not on file  Tobacco Use  . Smoking status: Never Smoker  . Smokeless tobacco: Never Used  Vaping Use  . Vaping Use: Never used  Substance and Sexual Activity  . Alcohol use: No  . Drug use: No  . Sexual activity: Never  Other Topics Concern  . Not on file  Social History Narrative  . Not on file   Social Determinants of Health   Financial Resource Strain:   . Difficulty of Paying Living Expenses: Not on file  Food Insecurity:   . Worried About Charity fundraiser in the Last Year: Not on file  . Ran Out of Food in the Last Year: Not on file  Transportation Needs:   . Lack of Transportation (Medical): Not on file  . Lack of Transportation (Non-Medical): Not on file  Physical Activity:   . Days of Exercise per Week: Not on file  . Minutes of Exercise per Session: Not on file  Stress:   . Feeling of Stress : Not on  file  Social Connections:   . Frequency of Communication with Friends and Family: Not on file  . Frequency of Social Gatherings with Friends and Family: Not on file  . Attends Religious Services: Not on file  . Active Member of Clubs or Organizations: Not on file  . Attends Archivist Meetings: Not on file  . Marital Status: Not on file  Intimate Partner Violence:   . Fear of Current or Ex-Partner: Not on file  . Emotionally Abused: Not on file  . Physically Abused: Not on file  . Sexually Abused: Not on  file      PHYSICAL EXAM  Vitals:   12/29/19 0955  BP: (!) 145/87  Pulse: 71  Weight: 146 lb (66.2 kg)  Height: 5\' 6"  (1.676 m)   Body mass index is 23.57 kg/m.   Generalized: Well developed, in no acute distress  Mallampati 3+ Neurological examination  Mentation: Alert oriented to time, place, history taking. Follows all commands speech and language fluent Cranial nerve II-XII: Pupils were equal round reactive to light. Extraocular movements were full, visual field were full on confrontational test. Facial sensation and strength were normal. Uvula tongue midline. Head turning and shoulder shrug  were normal and symmetric. Motor: The motor testing reveals 5 over 5 strength of all 4 extremities. Good symmetric motor tone is noted throughout.  Sensory: Sensory testing is intact to soft touch on all 4 extremities. No evidence of extinction is noted.  Gait and station: Gait is normal.     DIAGNOSTIC DATA (LABS, IMAGING, TESTING) - I reviewed patient records, labs, notes, testing and imaging myself where available.  No results found for: WBC, HGB, HCT, MCV, PLT No results found for: NA, K, CL, CO2, GLUCOSE, BUN, CREATININE, CALCIUM, PROT, ALBUMIN, AST, ALT, ALKPHOS, BILITOT, GFRNONAA, GFRAA No results found for: CHOL, HDL, LDLCALC, LDLDIRECT, TRIG, CHOLHDL No results found for: HGBA1C No results found for: VITAMINB12 No results found for: TSH    ASSESSMENT AND  PLAN  64 y.o. year old female  has a past medical history of Chronic constipation, Common migraine with intractable migraine (10/21/2016), Migraine, and Osteoporosis. here with   Common migraine with intractable migraine  Morning headache  Depression with anxiety  Neoma is stable from a migraine perspective but continues to have frequent morning headaches. We have discussed most likely contributors. She does have concerning symptoms of sleep apnea. Increased stress levels most likely contribute. Blood pressures have been elevated. She will continue topiramate 200mg  daily. We will increase duloxetine to 60mg  daily to see if this will help both headaches and depression/anxiety. She may continue rizatriptan and cyclobenzaprine as needed. Advised to try to avoid regular use of Tylenol. I have encouraged her to consider sleep evaluation. She is adamant that she would not use CPAP and wishes to think about this. I have discussed risks of untreated sleep apnea and provided educational materials in AVS. She was encouraged to continue close follow up with PCP. Monitor BP regularly. Healthy lifestyle habits encouraged. She will follow up with [place] in 6 months, sooner if needed.   I spent 20 minutes of face-to-face and non-face-to-face time with patient.  This included previsit chart review, lab review, study review, order entry, electronic health record documentation, patient education.    Debbora Presto, MSN, FNP-C 12/29/2019, 10:54 AM  [facility] [address] [place], [place] [address] [phone]

## 2019-12-30 NOTE — Progress Notes (Signed)
I have read the note, and I agree with the clinical assessment and plan.  Starlette Thurow K Jazell Rosenau   

## 2020-04-10 ENCOUNTER — Telehealth: Payer: Self-pay | Admitting: Family Medicine

## 2020-04-10 MED ORDER — CYCLOBENZAPRINE HCL 10 MG PO TABS: 5.0000 mg | ORAL_TABLET | Freq: Every day | ORAL | 5 refills | Status: DC

## 2020-04-10 MED ORDER — RIZATRIPTAN BENZOATE 10 MG PO TABS: ORAL_TABLET | ORAL | 11 refills | Status: DC

## 2020-04-10 NOTE — Telephone Encounter (Signed)
Refills for Maxalt and Flexeril have been sent to [place] per patient request.

## 2020-04-10 NOTE — Telephone Encounter (Signed)
Pt request refills rizatriptan (MAXALT) 10 MG tablet and cyclobenzaprine (FLEXERIL) 10 MG tablet at [place]
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
## 2020-06-28 NOTE — Patient Instructions (Addendum)
Below is our plan:  We will continue topiramate 200mg  at bedtime and duloxetine 30mg  daily. I will add Emgality for migraine management. You will take 2 injections for the first dose. Call me if you tolerate it and we will continue regular maintenance dosing of 1 injection every month. Continue rizatriptan and cyclobenzaprine for abortive therapy. We can ondansetron for nausea.   Please make sure you are staying well hydrated. I recommend 50-60 ounces daily. Well balanced diet and regular exercise encouraged. Consistent sleep schedule with 6-8 hours recommended.   Please continue follow up with care team as directed.   Follow up 3-6 months   You may receive a survey regarding today's visit. I encourage you to leave honest feed back as I do use this information to improve patient care. Thank you for seeing me today!      Migraine Headache A migraine headache is a very strong throbbing pain on one side or both sides of your head. This type of headache can also cause other symptoms. It can last from 4 hours to 3 days. Talk with your doctor about what things may bring on (trigger) this condition. What are the causes? The exact cause of this condition is not known. This condition may be triggered or caused by:  Drinking alcohol.  Smoking.  Taking medicines, such as: ? Medicine used to treat chest pain (nitroglycerin). ? Birth control pills. ? Estrogen. ? Some blood pressure medicines.  Eating or drinking certain products.  Doing physical activity. Other things that may trigger a migraine headache include:  Having a menstrual period.  Pregnancy.  Hunger.  Stress.  Not getting enough sleep or getting too much sleep.  Weather changes.  Tiredness (fatigue). What increases the risk?  Being 52-59 years old.  Being female.  Having a family history of migraine headaches.  Being Caucasian.  Having depression or anxiety.  Being very overweight. What are the signs or  symptoms?  A throbbing pain. This pain may: ? Happen in any area of the head, such as on one side or both sides. ? Make it hard to do daily activities. ? Get worse with physical activity. ? Get worse around bright lights or loud noises.  Other symptoms may include: ? Feeling sick to your stomach (nauseous). ? Vomiting. ? Dizziness. ? Being sensitive to bright lights, loud noises, or smells.  Before you get a migraine headache, you may get warning signs (an aura). An aura may include: ? Seeing flashing lights or having blind spots. ? Seeing bright spots, halos, or zigzag lines. ? Having tunnel vision or blurred vision. ? Having numbness or a tingling feeling. ? Having trouble talking. ? Having weak muscles.  Some people have symptoms after a migraine headache (postdromal phase), such as: ? Tiredness. ? Trouble thinking (concentrating). How is this treated?  Taking medicines that: ? Relieve pain. ? Relieve the feeling of being sick to your stomach. ? Prevent migraine headaches.  Treatment may also include: ? Having acupuncture. ? Avoiding foods that bring on migraine headaches. ? Learning ways to control your body functions (biofeedback). ? Therapy to help you know and deal with negative thoughts (cognitive behavioral therapy). Follow these instructions at home: Medicines  Take over-the-counter and prescription medicines only as told by your doctor.  Ask your doctor if the medicine prescribed to you: ? Requires you to avoid driving or using heavy machinery. ? Can cause trouble pooping (constipation). You may need to take these steps to prevent or treat trouble  pooping:  Drink enough fluid to keep your pee (urine) pale yellow.  Take over-the-counter or prescription medicines.  Eat foods that are high in fiber. These include beans, whole grains, and fresh fruits and vegetables.  Limit foods that are high in fat and sugar. These include fried or sweet  foods. Lifestyle  Do not drink alcohol.  Do not use any products that contain nicotine or tobacco, such as cigarettes, e-cigarettes, and chewing tobacco. If you need help quitting, ask your doctor.  Get at least 8 hours of sleep every night.  Limit and deal with stress. General instructions  Keep a journal to find out what may bring on your migraine headaches. For example, write down: ? What you eat and drink. ? How much sleep you get. ? Any change in what you eat or drink. ? Any change in your medicines.  If you have a migraine headache: ? Avoid things that make your symptoms worse, such as bright lights. ? It may help to lie down in a dark, quiet room. ? Do not drive or use heavy machinery. ? Ask your doctor what activities are safe for you.  Keep all follow-up visits as told by your doctor. This is important.      Contact a doctor if:  You get a migraine headache that is different or worse than others you have had.  You have more than 15 headache days in one month. Get help right away if:  Your migraine headache gets very bad.  Your migraine headache lasts longer than 72 hours.  You have a fever.  You have a stiff neck.  You have trouble seeing.  Your muscles feel weak or like you cannot control them.  You start to lose your balance a lot.  You start to have trouble walking.  You pass out (faint).  You have a seizure. Summary  A migraine headache is a very strong throbbing pain on one side or both sides of your head. These headaches can also cause other symptoms.  This condition may be treated with medicines and changes to your lifestyle.  Keep a journal to find out what may bring on your migraine headaches.  Contact a doctor if you get a migraine headache that is different or worse than others you have had.  Contact your doctor if you have more than 15 headache days in a month. This information is not intended to replace advice given to you by your  health care provider. Make sure you discuss any questions you have with your health care provider. Document Revised: 06/19/2018 Document Reviewed: 04/09/2018 Elsevier Patient Education  2021 [employer].   Sleep Apnea Sleep apnea affects breathing during sleep. It causes breathing to stop for a short time or to become shallow. It can also increase the risk of:  Heart attack.  Stroke.  Being very overweight (obese).  Diabetes.  Heart failure.  Irregular heartbeat. The goal of treatment is to help you breathe normally again. What are the causes? There are three kinds of sleep apnea:  Obstructive sleep apnea. This is caused by a blocked or collapsed airway.  Central sleep apnea. This happens when the brain does not send the right signals to the muscles that control breathing.  Mixed sleep apnea. This is a combination of obstructive and central sleep apnea. The most common cause of this condition is a collapsed or blocked airway. This can happen if:  Your throat muscles are too relaxed.  Your tongue and tonsils are  too large.  You are overweight.  Your airway is too small.   What increases the risk?  Being overweight.  Smoking.  Having a small airway.  Being older.  Being female.  Drinking alcohol.  Taking medicines to calm yourself (sedatives or tranquilizers).  Having family members with the condition. What are the signs or symptoms?  Trouble staying asleep.  Being sleepy or tired during the day.  Getting angry a lot.  Loud snoring.  Headaches in the morning.  Not being able to focus your mind (concentrate).  Forgetting things.  Less interest in sex.  Mood swings.  Personality changes.  Feelings of sadness (depression).  Waking up a lot during the night to pee (urinate).  Dry mouth.  Sore throat. How is this diagnosed?  Your medical history.  A physical exam.  A test that is done when you are sleeping (sleep study). The test is  most often done in a sleep lab but may also be done at home. How is this treated?  Sleeping on your side.  Using a medicine to get rid of mucus in your nose (decongestant).  Avoiding the use of alcohol, medicines to help you relax, or certain pain medicines (narcotics).  Losing weight, if needed.  Changing your diet.  Not smoking.  Using a machine to open your airway while you sleep, such as: ? An oral appliance. This is a mouthpiece that shifts your lower jaw forward. ? A CPAP device. This device blows air through a mask when you breathe out (exhale). ? An EPAP device. This has valves that you put in each nostril. ? A BPAP device. This device blows air through a mask when you breathe in (inhale) and breathe out.  Having surgery if other treatments do not work. It is important to get treatment for sleep apnea. Without treatment, it can lead to:  High blood pressure.  Coronary artery disease.  In men, not being able to have an erection (impotence).  Reduced thinking ability.   Follow these instructions at home: Lifestyle  Make changes that your doctor recommends.  Eat a healthy diet.  Lose weight if needed.  Avoid alcohol, medicines to help you relax, and some pain medicines.  Do not use any products that contain nicotine or tobacco, such as cigarettes, e-cigarettes, and chewing tobacco. If you need help quitting, ask your doctor. General instructions  Take over-the-counter and prescription medicines only as told by your doctor.  If you were given a machine to use while you sleep, use it only as told by your doctor.  If you are having surgery, make sure to tell your doctor you have sleep apnea. You may need to bring your device with you.  Keep all follow-up visits as told by your doctor. This is important. Contact a doctor if:  The machine that you were given to use during sleep bothers you or does not seem to be working.  You do not get better.  You get  worse. Get help right away if:  Your chest hurts.  You have trouble breathing in enough air.  You have an uncomfortable feeling in your back, arms, or stomach.  You have trouble talking.  One side of your body feels weak.  A part of your face is hanging down. These symptoms may be an emergency. Do not wait to see if the symptoms will go away. Get medical help right away. Call your local emergency services (911 in the [place] Do not drive yourself to  the hospital. Summary  This condition affects breathing during sleep.  The most common cause is a collapsed or blocked airway.  The goal of treatment is to help you breathe normally while you sleep. This information is not intended to replace advice given to you by your health care provider. Make sure you discuss any questions you have with your health care provider. Document Revised: 12/12/2017 Document Reviewed: 10/21/2017 Elsevier Patient Education  [place].

## 2020-06-29 ENCOUNTER — Encounter: Payer: Self-pay | Admitting: Family Medicine

## 2020-06-29 ENCOUNTER — Ambulatory Visit: Payer: PRIVATE HEALTH INSURANCE | Admitting: Family Medicine

## 2020-06-29 VITALS — BP 137/82 | HR 65 | Ht 66.0 in | Wt 155.0 lb

## 2020-06-29 DIAGNOSIS — G43019 Migraine without aura, intractable, without status migrainosus: Secondary | ICD-10-CM

## 2020-06-29 DIAGNOSIS — F418 Other specified anxiety disorders: Secondary | ICD-10-CM

## 2020-06-29 MED ORDER — EMGALITY 120 MG/ML ~~LOC~~ SOAJ: 240.0000 mg | SUBCUTANEOUS | 0 refills | Status: DC

## 2020-06-29 MED ORDER — ONDANSETRON HCL 4 MG PO TABS: 4.0000 mg | ORAL_TABLET | Freq: Three times a day (TID) | ORAL | 0 refills | Status: AC | PRN

## 2020-06-30 NOTE — Progress Notes (Signed)
I have read the note, and I agree with the clinical assessment and plan.  Lakeia Bradshaw K Ethin Drummond   

## 2020-08-08 ENCOUNTER — Other Ambulatory Visit: Payer: Self-pay | Admitting: *Deleted

## 2020-08-09 MED ORDER — EMGALITY 120 MG/ML ~~LOC~~ SOAJ: 120.0000 mL | SUBCUTANEOUS | 5 refills | Status: DC

## 2020-09-06 ENCOUNTER — Other Ambulatory Visit (HOSPITAL_COMMUNITY): Payer: Self-pay

## 2020-11-22 NOTE — Patient Instructions (Signed)
Below is our plan:  We will continue Emgality injections every 30 days, topiramate '200mg'$  daily, rizatriptan and cyclobenzaprine as needed. Please continue duloxtetine as directed by rheumatology.   Please make sure you are staying well hydrated. I recommend 50-60 ounces daily. Well balanced diet and regular exercise encouraged. Consistent sleep schedule with 6-8 hours recommended.   Please continue follow up with care team as directed.   Follow up with me in 1 year   You may receive a survey regarding today's visit. I encourage you to leave honest feed back as I do use this information to improve patient care. Thank you for seeing me today!
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
## 2020-11-23 ENCOUNTER — Ambulatory Visit: Payer: No Typology Code available for payment source | Admitting: Family Medicine

## 2020-11-23 ENCOUNTER — Encounter: Payer: Self-pay | Admitting: Family Medicine

## 2020-11-23 VITALS — BP 143/89 | HR 74 | Ht 66.0 in | Wt 160.5 lb

## 2020-11-23 DIAGNOSIS — G43019 Migraine without aura, intractable, without status migrainosus: Secondary | ICD-10-CM | POA: Diagnosis not present

## 2021-01-02 ENCOUNTER — Other Ambulatory Visit: Payer: Self-pay | Admitting: Adult Health

## 2021-01-03 ENCOUNTER — Telehealth: Payer: Self-pay | Admitting: Family Medicine

## 2021-01-03 NOTE — Telephone Encounter (Signed)
We sent a refill for the patient already today for this medication. Should be available now.

## 2021-01-03 NOTE — Telephone Encounter (Signed)
Varsha with [facility] called requesting refill for pt's topiramate (TOPAMAX) 100 MG tablet. Pharmacy [facility], [place].

## 2021-01-04 ENCOUNTER — Telehealth: Payer: Self-pay | Admitting: Family Medicine

## 2021-01-04 MED ORDER — RIZATRIPTAN BENZOATE 10 MG PO TABS: ORAL_TABLET | ORAL | 11 refills | Status: DC

## 2021-01-04 NOTE — Telephone Encounter (Signed)
Pt request prescription for rizatriptan (MAXALT) 10 MG tablet send to  [facility], [place], [place] [address] Phone no: [phone]

## 2021-03-19 ENCOUNTER — Other Ambulatory Visit: Payer: Self-pay

## 2021-03-19 MED ORDER — EMGALITY 120 MG/ML ~~LOC~~ SOAJ: 120.0000 mL | SUBCUTANEOUS | 5 refills | Status: DC

## 2021-08-13 ENCOUNTER — Other Ambulatory Visit: Payer: Self-pay

## 2021-08-13 MED ORDER — CYCLOBENZAPRINE HCL 10 MG PO TABS: 5.0000 mg | ORAL_TABLET | Freq: Every day | ORAL | 3 refills | Status: DC

## 2021-08-22 ENCOUNTER — Other Ambulatory Visit: Payer: Self-pay

## 2021-08-22 ENCOUNTER — Ambulatory Visit: Payer: PRIVATE HEALTH INSURANCE | Attending: Physician Assistant | Admitting: Physical Therapy

## 2021-08-22 DIAGNOSIS — M25552 Pain in left hip: Secondary | ICD-10-CM | POA: Diagnosis present

## 2021-08-22 DIAGNOSIS — M25551 Pain in right hip: Secondary | ICD-10-CM | POA: Diagnosis present

## 2021-08-22 DIAGNOSIS — M25561 Pain in right knee: Secondary | ICD-10-CM | POA: Diagnosis present

## 2021-08-22 DIAGNOSIS — G8929 Other chronic pain: Secondary | ICD-10-CM | POA: Diagnosis present

## 2021-08-22 DIAGNOSIS — M545 Low back pain, unspecified: Secondary | ICD-10-CM | POA: Diagnosis present

## 2021-08-22 DIAGNOSIS — M6281 Muscle weakness (generalized): Secondary | ICD-10-CM | POA: Diagnosis present

## 2021-08-22 NOTE — Therapy (Signed)
OUTPATIENT PHYSICAL THERAPY LOWER EXTREMITY EVALUATION   Patient Name: Jody Dixon MRN: [id number] DOB:1955-07-20, 66 y.o., female Today's Date: 08/23/2021   PT End of Session - 08/23/21 0910     Visit Number 1    Number of Visits --   1-2x/week   Date for PT Re-Evaluation 10/18/21    Authorization Type [employer] - FOTO    PT Start Time 1615    PT Stop Time 1245    PT Time Calculation (min) 43 min             Past Medical History:  Diagnosis Date   Chronic constipation    Common migraine with intractable migraine 10/21/2016   Migraine    Osteoporosis    Past Surgical History:  Procedure Laterality Date   FRACTURE SURGERY Right    foot, crushing injury   Patient Active Problem List   Diagnosis Date Noted   Common migraine with intractable migraine 10/21/2016    PCP: Pieter Partridge, PA  REFERRING PROVIDER: Verneita Griffes, PA-C  THERAPY DIAG:  Pain in left hip - Plan: PT plan of care cert/re-cert  Pain in right hip - Plan: PT plan of care cert/re-cert  Low back pain, unspecified back pain laterality, unspecified chronicity, unspecified whether sciatica present - Plan: PT plan of care cert/re-cert  Chronic pain of right knee - Plan: PT plan of care cert/re-cert  Muscle weakness - Plan: PT plan of care cert/re-cert  REFERRING DIAG: Pain in left hip [M25.552], Other chronic pain [G89.29]  Rationale for Evaluation and Treatment Rehabilitation  SUBJECTIVE:  PERTINENT PAST HISTORY:  Osteoporosis, R menisectomy in October, low back pain      PRECAUTIONS: osteoporosis  WEIGHT BEARING RESTRICTIONS No  FALLS:  Has patient fallen in last 6 months? No, Number of falls: 0  MOI/History of condition:  Onset date: chronic >3 months  Jody Dixon is a 66 y.o. female who presents to clinic with chief complaint of L hip pain.  Denies trauma, slow onset.  Interfering with walking up stairs and sleeping on L hip.  GT steroid injection was not helpful.  Concurrent  chronic low back pain and R knee pain.  R knee menisectomy in October of 22 with little relief.  She also has a history of R hip fracture in 22 with some residual pain.  Reports intentional weight loss of 12 lbs to reduce strain on hips and knee.  From referring provider:   "Camreigh Michie is a 66 y.o. here for repeat evaluation of her left hip. She has been having ongoing left hip pains for the last several months. She has the greatest difficulty sleeping on that side or climbing stairs. She locates pain over the lateral aspect of the left hip with radiation into the posterior aspect. Denies any falls or injuries. This is making her daily activities difficult. She underwent a greater trochanteric steroid injection at her last visit which she states did not provide significant relief of her symptoms. She would like to discuss further management options concerning her left hip at this time. Denies any catching or locking within the hip.  HPI from LOV: Patient presents to clinic today for evaluation management of left hip pain. She states that she has had ongoing pain in the left hip for several months. This may have been precipitated by increased reliance on the left lower extremity after right knee surgery for partial meniscectomy performed by Dr. Prudy Feeler in October. She states that the left hip  bothers her when she is sleeping and she has difficulty sleeping on that side. Localizes the pain to the lateral aspect of the left hip. She is unsure if she has received a corticosteroid injection in this area in the past, however there is no record of this in the medical system. Denies any numbness or weakness does endorse some mild radicular symptoms to the lateral thigh which have been improved with increased dosage of Cymbalta.  The patient presents for evaluation of hip pain associated with sleeping and walking. It has been bothering her for several months. The patient does not remember a fall or inciting  event. The pain is worse with standing and sitting, better with rest. There is pain with lying on this side at night. The pain does radiate down the lateral side of the leg to the knee."   Red flags:  Denies unexplained weight loss  Pain:  Are you having pain? Yes Pain location: L lateral and posterior hip pain NPRS scale:  current 3/10  average 5/10  Aggravating factors: stairs, sleeping on L side  NPRS, highest: 7/10 Relieving factors: no relieving factors  NPRS: best: 2/10 Pain description: intermittent, aching, and throbbing Stage: Chronic Stability: getting worse 24 hour pattern: worst at night   Occupation: not working, Journalist, newspaper: NA  Hand Dominance: NA  Patient Goals/Specific Activities: improve R hip pain   OBJECTIVE:   DIAGNOSTIC FINDINGS:  None current in chart   GENERAL OBSERVATION/GAIT:   R hip drop in L stance  SENSATION:  Light touch: Appears intact  PALPATION: TTP bil GT and L piriformis  MUSCLE LENGTH: Hamstrings: Right moderate restriction; Left moderate restriction with back pain  LE MMT:  MMT Right 08/23/2021 Left 08/23/2021  Hip flexion (L2, L3) 3+ 3+  Knee extension (L3) 5 5  Knee flexion 4 4  Hip abduction 4 3+  Hip extension Unable d/t back pain Unable d/t back pain  Hip external rotation    Hip internal rotation    Hip adduction    Ankle dorsiflexion (L4)    Ankle plantarflexion (S1)    Ankle inversion    Ankle eversion    Great Toe ext (L5)    Grossly     (Blank rows = not tested, score listed is out of 5 possible points.  N = WNL, D = diminished, C = clear for gross weakness with myotome testing, * = concordant pain with testing)  LE ROM:  ROM Right 08/23/2021 Left 08/23/2021  Hip flexion 90* 90*  Hip extension Limited* Limited*  Hip abduction    Hip adduction    Hip internal rotation N* N*  Hip external rotation N* N*  Knee flexion 90* N  Knee extension N N  Ankle dorsiflexion    Ankle  plantarflexion    Ankle inversion    Ankle eversion     (Blank rows = not tested, N = WNL, * = concordant pain with testing)  Functional Tests  Eval (08/23/2021)    Progressive balance screen (highest level completed for >/= 10''):  SLS: R 3'', L 5''                                                           SPECIAL TESTS:  SLR (-)  Fadir and fabir (+) for  back pain > hip pain  PATIENT SURVEYS:  FOTO 54 -> 70   TODAY'S TREATMENT: Creating, reviewing, and completing below HEP   PATIENT EDUCATION:  POC, diagnosis, prognosis, HEP, and outcome measures.  Pt educated via explanation, demonstration, and handout (HEP).  Pt confirms understanding verbally.   HOME EXERCISE PROGRAM: Access Code: [email or web address] URL: [email or web address] Date: 08/22/2021 Prepared by: Shearon Balo  Exercises - Seated Piriformis Stretch with Trunk Bend  - 1 x daily - 7 x weekly - 3 sets - 10 reps - Hooklying Isometric Clamshell  - 1 x daily - 7 x weekly - 3 sets - 10 reps - Supine Hip Adduction Isometric with Ball  - 1 x daily - 7 x weekly - 2 sets - 10 reps - 10'' hold  ASTERISK SIGNS   Asterisk Signs Eval (08/23/2021)       L hip pain 5/10 average, max 7/10       L SLS balance 5''       L hip abd and flexion strength 3+                         ASSESSMENT:  CLINICAL IMPRESSION: Jody Dixon is a 66 y.o. female who presents to clinic with signs and sxs consistent with L hip pain.  The pain generators seem multifactorial.  She has sxs consistent with GTPS bil.  She is exquisitely TTP L piriformis.  She also has significant LBP with is agg'd by hip movements.  No clear radiculopathy at this time.  She does have osteoporosis.  I cautioned her on excessive lumbar flexion with piriformis stretch and to avoid any back pain; she confirms understanding.    OBJECTIVE IMPAIRMENTS: Pain, hip ROM, hip and LE strength, balance, gait  ACTIVITY LIMITATIONS: bending, squatting, walking,  lifting  PERSONAL FACTORS: See medical history and pertinent history   REHAB POTENTIAL: Good  CLINICAL DECISION MAKING: Stable/uncomplicated  EVALUATION COMPLEXITY: Low   GOALS:   SHORT TERM GOALS: Target date: 09/13/2021  Jody Dixon will be >75% HEP compliant to improve carryover between sessions and facilitate independent management of condition  Evaluation (08/23/2021): ongoing Goal status: INITIAL   LONG TERM GOALS: Target date: 10/18/2021  Jody Dixon will improve FOTO score to 70 as a proxy for functional improvement  Evaluation/Baseline (08/23/2021): 54 Goal status: INITIAL   2.  Jody Dixon will self report >/= 50% decrease in pain from evaluation   Evaluation/Baseline (08/23/2021): 7/10 max, 5/10 average pain Goal status: INITIAL   3.  Jody Dixon will be able to stand for >30'' in L SLS stance, to show a significant improvement in balance in order to reduce fall risk   Evaluation/Baseline (08/23/2021): 30'' Goal status: INITIAL   4.  Jody Dixon will improve the following MMTs to >/= 4/5 to show improvement in strength:  L hip flexion and abd   Evaluation/Baseline (08/23/2021): see chart in note Goal status: INITIAL   PLAN: PT FREQUENCY: 1-2x/week  PT DURATION: 8 weeks (Ending 10/18/2021)  PLANNED INTERVENTIONS: Therapeutic exercises, Aquatic therapy, Therapeutic activity, Neuro Muscular re-education, Gait training, Patient/Family education, Joint mobilization, Dry Needling, Electrical stimulation, Spinal mobilization and/or manipulation, Moist heat, Taping, Vasopneumatic device, Ionotophoresis '4mg'$ /ml Dexamethasone, and Manual therapy  PLAN FOR NEXT SESSION: TDN L piriformis, progressive hip strengthening, balance and gait   Shearon Balo PT, DPT 08/23/2021, 9:13 AM

## 2021-08-23 ENCOUNTER — Encounter: Payer: Self-pay | Admitting: Physical Therapy

## 2021-09-04 ENCOUNTER — Other Ambulatory Visit: Payer: Self-pay

## 2021-09-04 MED ORDER — EMGALITY 120 MG/ML ~~LOC~~ SOAJ: 120.0000 mL | SUBCUTANEOUS | 5 refills | Status: DC

## 2021-09-06 ENCOUNTER — Ambulatory Visit: Payer: PRIVATE HEALTH INSURANCE

## 2021-09-06 DIAGNOSIS — M25552 Pain in left hip: Secondary | ICD-10-CM | POA: Diagnosis not present

## 2021-09-06 DIAGNOSIS — M545 Low back pain, unspecified: Secondary | ICD-10-CM

## 2021-09-06 DIAGNOSIS — M6281 Muscle weakness (generalized): Secondary | ICD-10-CM

## 2021-09-06 DIAGNOSIS — G8929 Other chronic pain: Secondary | ICD-10-CM

## 2021-09-06 DIAGNOSIS — M25551 Pain in right hip: Secondary | ICD-10-CM

## 2021-09-06 NOTE — Therapy (Signed)
OUTPATIENT PHYSICAL THERAPY TREATMENT NOTE   Patient Name: Jody Dixon MRN: [id number] DOB:01/19/1956, 66 y.o., female Today's Date: 09/06/2021  PCP: Jody Partridge, PA REFERRING PROVIDER: Verneita Griffes, PA-C  END OF SESSION:   PT End of Session - 09/06/21 1746     Visit Number 2    Date for PT Re-Evaluation 10/18/21    Authorization Type [employer] - FOTO    PT Start Time 1748    PT Stop Time 1827    PT Time Calculation (min) 39 min    Activity Tolerance Patient tolerated treatment well    Behavior During Therapy [facility] for tasks assessed/performed             Past Medical History:  Diagnosis Date   Chronic constipation    Common migraine with intractable migraine 10/21/2016   Migraine    Osteoporosis    Past Surgical History:  Procedure Laterality Date   FRACTURE SURGERY Right    foot, crushing injury   Patient Active Problem List   Diagnosis Date Noted   Common migraine with intractable migraine 10/21/2016    REFERRING DIAG: Pain in left hip [M25.552], Other chronic pain [G89.29]  THERAPY DIAG:  Pain in left hip  Pain in right hip  Low back pain, unspecified back pain laterality, unspecified chronicity, unspecified whether sciatica present  Chronic pain of right knee  Muscle weakness  Rationale for Evaluation and Treatment Rehabilitation  PERTINENT HISTORY: Osteoporosis, R menisectomy in October, low back pain  PRECAUTIONS: osteoporosis  SUBJECTIVE: Pt reports doing her HEP about every other day. She reports 8/10 Rt knee pain and 5/10 BIL hip pain.  Pain:  Are you having pain? Yes Pain location: 5/10 BIL hips, 8/10 Rt knee Aggravating factors: stairs, sleeping on L side           NPRS, highest: 7/10 Relieving factors: no relieving factors           NPRS: best: 2/10 Pain description: intermittent, aching, and throbbing Stage: Chronic Stability: getting worse 24 hour pattern: worst at night    OBJECTIVE: (objective measures completed at  initial evaluation unless otherwise dated)   DIAGNOSTIC FINDINGS:  None current in chart             GENERAL OBSERVATION/GAIT:                     R hip drop in L stance   SENSATION:          Light touch: Appears intact   PALPATION: TTP bil GT and L piriformis   MUSCLE LENGTH: Hamstrings: Right moderate restriction; Left moderate restriction with back pain   LE MMT:   MMT Right 08/23/2021 Left 08/23/2021  Hip flexion (L2, L3) 3+ 3+  Knee extension (L3) 5 5  Knee flexion 4 4  Hip abduction 4 3+  Hip extension Unable d/t back pain Unable d/t back pain  Hip external rotation      Hip internal rotation      Hip adduction      Ankle dorsiflexion (L4)      Ankle plantarflexion (S1)      Ankle inversion      Ankle eversion      Great Toe ext (L5)      Grossly        (Blank rows = not tested, score listed is out of 5 possible points.  N = WNL, D = diminished, C = clear for gross weakness with myotome testing, * =  concordant pain with testing)   LE ROM:   ROM Right 08/23/2021 Left 08/23/2021  Hip flexion 90* 90*  Hip extension Limited* Limited*  Hip abduction      Hip adduction      Hip internal rotation N* N*  Hip external rotation N* N*  Knee flexion 90* N  Knee extension N N  Ankle dorsiflexion      Ankle plantarflexion      Ankle inversion      Ankle eversion        (Blank rows = not tested, N = WNL, * = concordant pain with testing)   Functional Tests   Eval (08/23/2021)      Progressive balance screen (highest level completed for >/= 10''):   SLS: R 3'', L 5''                                                                                                              SPECIAL TESTS:           SLR (-)           Fadir and fabir (+) for back pain > hip pain   PATIENT SURVEYS:  FOTO 54 -> 70     TODAY'S TREATMENT:  [facility] Adult PT Treatment:                                                DATE: 09/06/2021 Therapeutic Exercise: Sidelying IT  band stretch x22mn BIL Standing kickstand stance Pallof press x10 with 5-sec hold BIL in hip ER and hip IR Standing hip extension with 7# cable to ankle attachment 2x10 BIL Standing hip abduction with 7# cable to ankle attachment 2x10 BIL Standing hip flexion and subsequent knee extension with 7# cable to ankle attachment x10 BIL Manual Therapy: Supine lateral hip distraction with belt and gentle oscillation x4 min BIL Neuromuscular re-ed: N/A Therapeutic Activity: N/A Modalities: N/A Self Care: N/A      PATIENT EDUCATION:  POC, diagnosis, prognosis, HEP, and outcome measures.  Pt educated via explanation, demonstration, and handout (HEP).  Pt confirms understanding verbally.    HOME EXERCISE PROGRAM: Access Code: [email or web address]: [email or web address] Date: 08/22/2021 Prepared by: KShearon Balo  Exercises - Seated Piriformis Stretch with Trunk Bend  - 1 x daily - 7 x weekly - 3 sets - 10 reps - Hooklying Isometric Clamshell  - 1 x daily - 7 x weekly - 3 sets - 10 reps - Supine Hip Adduction Isometric with Ball  - 1 x daily - 7 x weekly - 2 sets - 10 reps - 10'' hold  Added 09/06/2021 - Sidelying ITB Stretch off Table  - 1 x daily - 7 x weekly - 2-min hold   ASTERISK SIGNS     Asterisk Signs Eval (08/23/2021)            L hip pain 5/10 average, max 7/10  L SLS balance 5''            L hip abd and flexion strength 3+                                              ASSESSMENT:   CLINICAL IMPRESSION: Pt responded well to all interventions today, demonstrating good form and minor increase in pain with new exercises. She reports a therapeutic response to IT band stretching and manual hip distraction. The pt will continue to benefit from skilled PT to address her primary impairments and return to her prior level of function with less limitation.   OBJECTIVE IMPAIRMENTS: Pain, hip ROM, hip and LE strength, balance, gait   ACTIVITY LIMITATIONS:  bending, squatting, walking, lifting   PERSONAL FACTORS: See medical history and pertinent history       GOALS:     SHORT TERM GOALS: Target date: 09/13/2021   Jody Dixon will be >75% HEP compliant to improve carryover between sessions and facilitate independent management of condition   Evaluation (08/23/2021): ongoing Goal status: INITIAL     LONG TERM GOALS: Target date: 10/18/2021   Torey will improve FOTO score to 70 as a proxy for functional improvement   Evaluation/Baseline (08/23/2021): 54 Goal status: INITIAL     2.  Bryce will self report >/= 50% decrease in pain from evaluation    Evaluation/Baseline (08/23/2021): 7/10 max, 5/10 average pain Goal status: INITIAL     3.  [place] will be able to stand for >30'' in L SLS stance, to show a significant improvement in balance in order to reduce fall risk    Evaluation/Baseline (08/23/2021): 30'' Goal status: INITIAL     4.  Avian will improve the following MMTs to >/= 4/5 to show improvement in strength:  L hip flexion and abd    Evaluation/Baseline (08/23/2021): see chart in note Goal status: INITIAL     PLAN: PT FREQUENCY: 1-2x/week   PT DURATION: 8 weeks (Ending 10/18/2021)   PLANNED INTERVENTIONS: Therapeutic exercises, Aquatic therapy, Therapeutic activity, Neuro Muscular re-education, Gait training, Patient/Family education, Joint mobilization, Dry Needling, Electrical stimulation, Spinal mobilization and/or manipulation, Moist heat, Taping, Vasopneumatic device, Ionotophoresis '4mg'$ /ml Dexamethasone, and Manual therapy   PLAN FOR NEXT SESSION: TDN L piriformis, progressive hip strengthening, balance and gait    Vanessa [place], PT, DPT 09/06/21 6:30 PM

## 2021-09-12 ENCOUNTER — Ambulatory Visit: Payer: PRIVATE HEALTH INSURANCE | Attending: Physician Assistant

## 2021-09-12 DIAGNOSIS — M545 Low back pain, unspecified: Secondary | ICD-10-CM | POA: Insufficient documentation

## 2021-09-12 DIAGNOSIS — M25561 Pain in right knee: Secondary | ICD-10-CM | POA: Diagnosis present

## 2021-09-12 DIAGNOSIS — M25551 Pain in right hip: Secondary | ICD-10-CM | POA: Diagnosis present

## 2021-09-12 DIAGNOSIS — M6281 Muscle weakness (generalized): Secondary | ICD-10-CM | POA: Insufficient documentation

## 2021-09-12 DIAGNOSIS — M25552 Pain in left hip: Secondary | ICD-10-CM | POA: Insufficient documentation

## 2021-09-12 DIAGNOSIS — G8929 Other chronic pain: Secondary | ICD-10-CM | POA: Diagnosis present

## 2021-09-12 NOTE — Therapy (Signed)
OUTPATIENT PHYSICAL THERAPY TREATMENT NOTE   Patient Name: Jody Dixon MRN: [id number] DOB:01/27/1956, 66 y.o., female Today's Date: 09/12/2021  PCP: Pieter Partridge, PA REFERRING PROVIDER: Verneita Griffes, PA-C  END OF SESSION:   Jody Dixon End of Session - 09/12/21 1044     Visit Number 3    Date for Jody Dixon Re-Evaluation 10/18/21    Authorization Type [employer] - FOTO    Jody Dixon Start Time 1045    Jody Dixon Stop Time 1128   5 minutes dry needle insertion   Jody Dixon Time Calculation (min) 43 min    Activity Tolerance Patient tolerated treatment well    Behavior During Therapy WFL for tasks assessed/performed              Past Medical History:  Diagnosis Date   Chronic constipation    Common migraine with intractable migraine 10/21/2016   Migraine    Osteoporosis    Past Surgical History:  Procedure Laterality Date   FRACTURE SURGERY Right    foot, crushing injury   Patient Active Problem List   Diagnosis Date Noted   Common migraine with intractable migraine 10/21/2016    REFERRING DIAG: Pain in left hip [M25.552], Other chronic pain [G89.29]  THERAPY DIAG:  Pain in left hip  Pain in right hip  Low back pain, unspecified back pain laterality, unspecified chronicity, unspecified whether sciatica present  Chronic pain of right knee  Muscle weakness  Rationale for Evaluation and Treatment Rehabilitation  PERTINENT HISTORY: Osteoporosis, R menisectomy in October, low back pain  PRECAUTIONS: osteoporosis  SUBJECTIVE: Jody Dixon reports increased Rt knee pain the past few days. This is exacerbated when going upstairs. She reports she has a follow-up appointment with her orthopedic doctor tomorrow.   Pain:  Are you having pain? Yes Pain location: 5/10 BIL Lt>Rt hips, 8/10 Rt knee Aggravating factors: stairs, sleeping on L side           NPRS, highest: 7/10 Relieving factors: no relieving factors           NPRS: best: 2/10 Pain description: intermittent, aching, and throbbing Stage:  Chronic Stability: getting worse 24 hour pattern: worst at night    OBJECTIVE: (objective measures completed at initial evaluation unless otherwise dated)   DIAGNOSTIC FINDINGS:  None current in chart             GENERAL OBSERVATION/GAIT:                     R hip drop in L stance   SENSATION:          Light touch: Appears intact   PALPATION: TTP bil GT and L piriformis   MUSCLE LENGTH: Hamstrings: Right moderate restriction; Left moderate restriction with back pain   LE MMT:   MMT Right 08/23/2021 Left 08/23/2021  Hip flexion (L2, L3) 3+ 3+  Knee extension (L3) 5 5  Knee flexion 4 4  Hip abduction 4 3+  Hip extension Unable d/t back pain Unable d/t back pain  Hip external rotation      Hip internal rotation      Hip adduction      Ankle dorsiflexion (L4)      Ankle plantarflexion (S1)      Ankle inversion      Ankle eversion      Great Toe ext (L5)      Grossly        (Blank rows = not tested, score listed is out of 5 possible  points.  N = WNL, D = diminished, C = clear for gross weakness with myotome testing, * = concordant pain with testing)   LE ROM:   ROM Right 08/23/2021 Left 08/23/2021  Hip flexion 90* 90*  Hip extension Limited* Limited*  Hip abduction      Hip adduction      Hip internal rotation N* N*  Hip external rotation N* N*  Knee flexion 90* N  Knee extension N N  Ankle dorsiflexion      Ankle plantarflexion      Ankle inversion      Ankle eversion        (Blank rows = not tested, N = WNL, * = concordant pain with testing)   Functional Tests   Eval (08/23/2021)      Progressive balance screen (highest level completed for >/= 10''):   SLS: R 3'', L 5''                                                                                                              SPECIAL TESTS:           SLR (-)           Fadir and fabir (+) for back pain > hip pain   PATIENT SURVEYS:  FOTO 54 -> 70     TODAY'S TREATMENT:  [facility] Adult  Jody Dixon Treatment:                                                DATE: 09/12/2021 Therapeutic Exercise: Seated hamstring curl machine with 25#, 2x10 with 5-sec eccentric return Seated active hamstring stretch x30mn on Rt 4-inch lateral heel taps 2x10 BIL Forward lunges on each side of BOSU ball with UE support as needed 2x10 BIL each side Manual Therapy: Skilled palpation to identify trigger points prior to TPDN Effleurage to muscles treated with TPDN Neuromuscular re-ed: N/A Therapeutic Activity: N/A Modalities: N/A Self Care: N/A  Trigger Point Dry-Needling  Treatment instructions: Expect mild to moderate muscle soreness. S/S of pneumothorax if dry needled over a lung field, and to seek immediate medical attention should they occur. Patient verbalized understanding of these instructions and education.  Patient Consent Given: Yes Education handout provided: No Muscles treated: Rt semimembranosus/ semitendinosus and Rt rectus femorus Electrical stimulation performed: No Parameters: N/A Treatment response/outcome: Twitch response/ improved muscle extensibility   [facility] Adult Jody Dixon Treatment:                                                DATE: 09/06/2021 Therapeutic Exercise: Sidelying IT band stretch x236m BIL Standing kickstand stance Pallof press x10 with 5-sec hold BIL in hip ER and hip IR Standing hip extension with 7# cable to ankle attachment 2x10 BIL Standing  hip abduction with 7# cable to ankle attachment 2x10 BIL Standing hip flexion and subsequent knee extension with 7# cable to ankle attachment x10 BIL Manual Therapy: Supine lateral hip distraction with belt and gentle oscillation x4 min BIL Neuromuscular re-ed: N/A Therapeutic Activity: N/A Modalities: N/A Self Care: N/A      PATIENT EDUCATION:  POC, diagnosis, prognosis, HEP, and outcome measures.  Jody Dixon educated via explanation, demonstration, and handout (HEP).  Jody Dixon confirms understanding verbally.    HOME EXERCISE  PROGRAM: Access Code: [email or web address] URL: [email or web address] Date: 08/22/2021 Prepared by: Jody Dixon   Exercises - Seated Piriformis Stretch with Trunk Bend  - 1 x daily - 7 x weekly - 3 sets - 10 reps - Hooklying Isometric Clamshell  - 1 x daily - 7 x weekly - 3 sets - 10 reps - Supine Hip Adduction Isometric with Ball  - 1 x daily - 7 x weekly - 2 sets - 10 reps - 10'' hold  Added 09/06/2021 - Sidelying ITB Stretch off Table  - 1 x daily - 7 x weekly - 2-min hold   ASTERISK SIGNS     Asterisk Signs Eval (08/23/2021)            L hip pain 5/10 average, max 7/10            L SLS balance 5''            L hip abd and flexion strength 3+                                              ASSESSMENT:   CLINICAL IMPRESSION: Jody Dixon responded excellently to all interventions today, demonstrating good form and no increase in pain with exercises. Upon assessment of Jody Dixon's posterior Rt knee, multiple trigger points were identified in her distal hamstrings. The Jody Dixon responded well to TPDN and manual techniques today. She will continue to benefit from skilled Jody Dixon to address her primary impairments and return to her prior level of function with less limitation.   OBJECTIVE IMPAIRMENTS: Pain, hip ROM, hip and LE strength, balance, gait   ACTIVITY LIMITATIONS: bending, squatting, walking, lifting   PERSONAL FACTORS: See medical history and pertinent history       GOALS:     SHORT TERM GOALS: Target date: 09/13/2021   Jody Dixon will be >75% HEP compliant to improve carryover between sessions and facilitate independent management of condition   Evaluation (08/23/2021): ongoing Goal status: INITIAL     LONG TERM GOALS: Target date: 10/18/2021   Jody Dixon will improve FOTO score to 70 as a proxy for functional improvement   Evaluation/Baseline (08/23/2021): 54 Goal status: INITIAL     2.  Jody Dixon will self report >/= 50% decrease in pain from evaluation    Evaluation/Baseline (08/23/2021):  7/10 max, 5/10 average pain Goal status: INITIAL     3.  Jody Dixon will be able to stand for >30'' in L SLS stance, to show a significant improvement in balance in order to reduce fall risk    Evaluation/Baseline (08/23/2021): 30'' Goal status: INITIAL     4.  Jody Dixon will improve the following MMTs to >/= 4/5 to show improvement in strength:  L hip flexion and abd    Evaluation/Baseline (08/23/2021): see chart in note Goal status: INITIAL     PLAN: Jody Dixon FREQUENCY: 1-2x/week   Jody Dixon DURATION: 8 weeks (Ending 10/18/2021)  PLANNED INTERVENTIONS: Therapeutic exercises, Aquatic therapy, Therapeutic activity, Neuro Muscular re-education, Gait training, Patient/Family education, Joint mobilization, Dry Needling, Electrical stimulation, Spinal mobilization and/or manipulation, Moist heat, Taping, Vasopneumatic device, Ionotophoresis '4mg'$ /ml Dexamethasone, and Manual therapy   PLAN FOR NEXT SESSION: TDN L piriformis, progressive hip strengthening, balance and gait    Vanessa Franklin, Jody Dixon, DPT 09/12/21 11:29 AM

## 2021-09-13 NOTE — Therapy (Signed)
OUTPATIENT PHYSICAL THERAPY TREATMENT NOTE   Patient Name: Jody Dixon MRN: [id number] DOB:31-Oct-1955, 66 y.o., female Today's Date: 09/14/2021  PCP: Pieter Partridge, PA REFERRING PROVIDER: Verneita Griffes, PA-C  END OF SESSION:   PT End of Session - 09/14/21 1051     Visit Number 4    Date for PT Re-Evaluation 10/18/21    Authorization Type [employer] - FOTO    PT Start Time 1050    PT Stop Time 1130    PT Time Calculation (min) 40 min    Activity Tolerance Patient tolerated treatment well    Behavior During Therapy [facility] for tasks assessed/performed               Past Medical History:  Diagnosis Date   Chronic constipation    Common migraine with intractable migraine 10/21/2016   Migraine    Osteoporosis    Past Surgical History:  Procedure Laterality Date   FRACTURE SURGERY Right    foot, crushing injury   Patient Active Problem List   Diagnosis Date Noted   Common migraine with intractable migraine 10/21/2016    REFERRING DIAG: Pain in left hip [M25.552], Other chronic pain [G89.29]  THERAPY DIAG:  Pain in left hip  Pain in right hip  Chronic pain of right knee  Rationale for Evaluation and Treatment Rehabilitation  PERTINENT HISTORY: Osteoporosis, R menisectomy in October, low back pain  PRECAUTIONS: osteoporosis  SUBJECTIVE: Saw ortho MD yesterday, will undergo imaging studies to determine cause of R knee pain.  Felt TPDN was helpful but R knee throbbing today following MD visit.  Does not feel she can tolerate Nustep and reports intermittent bouts of nausea as she has not eaten today. Pain:  Are you having pain? Yes Pain location: 5/10 BIL Lt>Rt hips, 8/10 Rt knee Aggravating factors: stairs, sleeping on L side           NPRS, highest: 7/10 Relieving factors: no relieving factors           NPRS: best: 2/10 Pain description: intermittent, aching, and throbbing Stage: Chronic Stability: getting worse 24 hour pattern: worst at night     OBJECTIVE: (objective measures completed at initial evaluation unless otherwise dated)   DIAGNOSTIC FINDINGS:  None current in chart             GENERAL OBSERVATION/GAIT:                     R hip drop in L stance   SENSATION:          Light touch: Appears intact   PALPATION: TTP bil GT and L piriformis   MUSCLE LENGTH: Hamstrings: Right moderate restriction; Left moderate restriction with back pain   LE MMT:   MMT Right 08/23/2021 Left 08/23/2021  Hip flexion (L2, L3) 3+ 3+  Knee extension (L3) 5 5  Knee flexion 4 4  Hip abduction 4 3+  Hip extension Unable d/t back pain Unable d/t back pain  Hip external rotation      Hip internal rotation      Hip adduction      Ankle dorsiflexion (L4)      Ankle plantarflexion (S1)      Ankle inversion      Ankle eversion      Great Toe ext (L5)      Grossly        (Blank rows = not tested, score listed is out of 5 possible points.  N =  WNL, D = diminished, C = clear for gross weakness with myotome testing, * = concordant pain with testing)   LE ROM:   ROM Right 08/23/2021 Left 08/23/2021  Hip flexion 90* 90*  Hip extension Limited* Limited*  Hip abduction      Hip adduction      Hip internal rotation N* N*  Hip external rotation N* N*  Knee flexion 90* N  Knee extension N N  Ankle dorsiflexion      Ankle plantarflexion      Ankle inversion      Ankle eversion        (Blank rows = not tested, N = WNL, * = concordant pain with testing)   Functional Tests   Eval (08/23/2021)      Progressive balance screen (highest level completed for >/= 10''):   SLS: R 3'', L 5''                                                                                                              SPECIAL TESTS:           SLR (-)           Fadir and fabir (+) for back pain > hip pain   PATIENT SURVEYS:  FOTO 54 -> 70     TODAY'S TREATMENT: [facility] Adult PT Treatment:                                                DATE:  09/14/21 Therapeutic Exercise: Nustep(declined) SKTC L 30s x2 L heel slides from slide board 15x L clamshells YTB 15x Standing PF facing wall 15x Standing marching against wall 15/15 alternating Standing DF against wall 15x Sidestepping at countertop 3 trips Manual Therapy: L piriformis release in R sidelie   [facility] Adult PT Treatment:                                                DATE: 09/12/2021 Therapeutic Exercise: Seated hamstring curl machine with 25#, 2x10 with 5-sec eccentric return Seated active hamstring stretch x40mn on Rt 4-inch lateral heel taps 2x10 BIL Forward lunges on each side of BOSU ball with UE support as needed 2x10 BIL each side Manual Therapy: Skilled palpation to identify trigger points prior to TPDN Effleurage to muscles treated with TPDN Neuromuscular re-ed: N/A Therapeutic Activity: N/A Modalities: N/A Self Care: N/A  Trigger Point Dry-Needling  Treatment instructions: Expect mild to moderate muscle soreness. S/S of pneumothorax if dry needled over a lung field, and to seek immediate medical attention should they occur. Patient verbalized understanding of these instructions and education.  Patient Consent Given: Yes Education handout provided: No Muscles treated: Rt semimembranosus/ semitendinosus and Rt rectus femorus Electrical stimulation performed: No Parameters: N/A Treatment  response/outcome: Twitch response/ improved muscle extensibility   [facility] Adult PT Treatment:                                                DATE: 09/06/2021 Therapeutic Exercise: Sidelying IT band stretch x41mn BIL Standing kickstand stance Pallof press x10 with 5-sec hold BIL in hip ER and hip IR Standing hip extension with 7# cable to ankle attachment 2x10 BIL Standing hip abduction with 7# cable to ankle attachment 2x10 BIL Standing hip flexion and subsequent knee extension with 7# cable to ankle attachment x10 BIL Manual Therapy: Supine lateral hip distraction with  belt and gentle oscillation x4 min BIL Neuromuscular re-ed: N/A Therapeutic Activity: N/A Modalities: N/A Self Care: N/A      PATIENT EDUCATION:  POC, diagnosis, prognosis, HEP, and outcome measures.  Pt educated via explanation, demonstration, and handout (HEP).  Pt confirms understanding verbally.    HOME EXERCISE PROGRAM: Access Code: [email or web address]: [email or web address] Date: 08/22/2021 Prepared by: KShearon Balo  Exercises - Seated Piriformis Stretch with Trunk Bend  - 1 x daily - 7 x weekly - 3 sets - 10 reps - Hooklying Isometric Clamshell  - 1 x daily - 7 x weekly - 3 sets - 10 reps - Supine Hip Adduction Isometric with Ball  - 1 x daily - 7 x weekly - 2 sets - 10 reps - 10'' hold  Added 09/06/2021 - Sidelying ITB Stretch off Table  - 1 x daily - 7 x weekly - 2-min hold   ASTERISK SIGNS     Asterisk Signs Eval (08/23/2021)            L hip pain 5/10 average, max 7/10            L SLS balance 5''            L hip abd and flexion strength 3+                                              ASSESSMENT:   CLINICAL IMPRESSION: Will undergo imaging on R knee.  Today's session addressed ongoing L hip pain.  Incorporated L piriformis release, added hip stretching as tolerated.  Performed strengthening tasks against light t-band resistance as well as weightbearing tasks to improve L hip strength and function.  Some nausea reported throughout session.  Limited tolerance to exercise and positioning today.    OBJECTIVE IMPAIRMENTS: Pain, hip ROM, hip and LE strength, balance, gait   ACTIVITY LIMITATIONS: bending, squatting, walking, lifting   PERSONAL FACTORS: See medical history and pertinent history       GOALS:     SHORT TERM GOALS: Target date: 09/13/2021   DBritinywill be >75% HEP compliant to improve carryover between sessions and facilitate independent management of condition   Evaluation (08/23/2021): ongoing Goal status: INITIAL     LONG  TERM GOALS: Target date: 10/18/2021   DCleonewill improve FOTO score to 70 as a proxy for functional improvement   Evaluation/Baseline (08/23/2021): 54 Goal status: INITIAL     2.  DMaguirewill self report >/= 50% decrease in pain from evaluation    Evaluation/Baseline (08/23/2021): 7/10 max, 5/10 average pain Goal status: INITIAL     3.  Marcela will be able to stand for >30'' in L SLS stance, to show a significant improvement in balance in order to reduce fall risk    Evaluation/Baseline (08/23/2021): 30'' Goal status: INITIAL     4.  Parilee will improve the following MMTs to >/= 4/5 to show improvement in strength:  L hip flexion and abd    Evaluation/Baseline (08/23/2021): see chart in note Goal status: INITIAL     PLAN: PT FREQUENCY: 1-2x/week   PT DURATION: 8 weeks (Ending 10/18/2021)   PLANNED INTERVENTIONS: Therapeutic exercises, Aquatic therapy, Therapeutic activity, Neuro Muscular re-education, Gait training, Patient/Family education, Joint mobilization, Dry Needling, Electrical stimulation, Spinal mobilization and/or manipulation, Moist heat, Taping, Vasopneumatic device, Ionotophoresis '4mg'$ /ml Dexamethasone, and Manual therapy   PLAN FOR NEXT SESSION: TDN L piriformis, progressive hip strengthening, balance and gait, manual as needed    Vanessa [place], PT, DPT 09/14/21 11:41 AM

## 2021-09-14 ENCOUNTER — Ambulatory Visit: Payer: PRIVATE HEALTH INSURANCE

## 2021-09-14 DIAGNOSIS — M25551 Pain in right hip: Secondary | ICD-10-CM

## 2021-09-14 DIAGNOSIS — M25552 Pain in left hip: Secondary | ICD-10-CM | POA: Diagnosis not present

## 2021-09-14 DIAGNOSIS — G8929 Other chronic pain: Secondary | ICD-10-CM

## 2021-09-19 ENCOUNTER — Ambulatory Visit: Payer: PRIVATE HEALTH INSURANCE | Admitting: Physical Therapy

## 2021-09-19 ENCOUNTER — Encounter: Payer: Self-pay | Admitting: Physical Therapy

## 2021-09-19 DIAGNOSIS — M25551 Pain in right hip: Secondary | ICD-10-CM

## 2021-09-19 DIAGNOSIS — M25552 Pain in left hip: Secondary | ICD-10-CM

## 2021-09-19 DIAGNOSIS — G8929 Other chronic pain: Secondary | ICD-10-CM

## 2021-09-19 NOTE — Patient Instructions (Signed)
Below is our plan:  We will continue Emgality, topiramate 200mg  and rizatriptan. Continue duloxetine per rheumatology direction. Consider discussion about possible side effects with Forteo with your endocrinology and consider referral to sleep med for sign/symptoms of sleep apnea.   Please make sure you are staying well hydrated. I recommend 50-60 ounces daily. Well balanced diet and regular exercise encouraged. Consistent sleep schedule with 6-8 hours recommended.   Please continue follow up with care team as directed.   Follow up with me in 6 month  You may receive a survey regarding today's visit. I encourage you to leave honest feed back as I do use this information to improve patient care. Thank you for seeing me today!
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
## 2021-09-19 NOTE — Therapy (Signed)
OUTPATIENT PHYSICAL THERAPY TREATMENT NOTE   Patient Name: Jody Dixon MRN: [id number] DOB:[birth year], 66 y.o., female Today's Date: 09/19/2021  PCP: Pieter Partridge, PA REFERRING PROVIDER: Verneita Griffes, PA-C  END OF SESSION:   PT End of Session - 09/19/21 1050     Visit Number 5    Date for PT Re-Evaluation 10/18/21    Authorization Type [employer] - FOTO    PT Start Time 1050    PT Stop Time 1130    PT Time Calculation (min) 40 min    Activity Tolerance Patient tolerated treatment well    Behavior During Therapy [facility] for tasks assessed/performed               Past Medical History:  Diagnosis Date   Chronic constipation    Common migraine with intractable migraine 10/21/2016   Migraine    Osteoporosis    Past Surgical History:  Procedure Laterality Date   FRACTURE SURGERY Right    foot, crushing injury   Patient Active Problem List   Diagnosis Date Noted   Common migraine with intractable migraine 10/21/2016    REFERRING DIAG: Pain in left hip [M25.552], Other chronic pain [G89.29]  THERAPY DIAG:  Pain in left hip  Pain in right hip  Chronic pain of right knee  Rationale for Evaluation and Treatment Rehabilitation  PERTINENT HISTORY: Osteoporosis, R menisectomy in October, low back pain  PRECAUTIONS: osteoporosis  SUBJECTIVE:   Pt reports that her R knee continues to be very painful and she has an MRI scheduled.  She has some improvement in her hip pain.  Pain:  Are you having pain? Yes Pain location: 5/10 BIL Lt>Rt hips, 8/10 Rt knee Aggravating factors: stairs, sleeping on L side           NPRS, highest: 7/10 Relieving factors: no relieving factors           NPRS: best: 2/10 Pain description: intermittent, aching, and throbbing Stage: Chronic Stability: getting worse 24 hour pattern: worst at night    OBJECTIVE: (objective measures completed at initial evaluation unless otherwise dated)   DIAGNOSTIC FINDINGS:  None current in chart              GENERAL OBSERVATION/GAIT:                     R hip drop in L stance   SENSATION:          Light touch: Appears intact   PALPATION: TTP bil GT and L piriformis   MUSCLE LENGTH: Hamstrings: Right moderate restriction; Left moderate restriction with back pain   LE MMT:   MMT Right 08/23/2021 Left 08/23/2021  Hip flexion (L2, L3) 3+ 3+  Knee extension (L3) 5 5  Knee flexion 4 4  Hip abduction 4 3+  Hip extension Unable d/t back pain Unable d/t back pain  Hip external rotation      Hip internal rotation      Hip adduction      Ankle dorsiflexion (L4)      Ankle plantarflexion (S1)      Ankle inversion      Ankle eversion      Great Toe ext (L5)      Grossly        (Blank rows = not tested, score listed is out of 5 possible points.  N = WNL, D = diminished, C = clear for gross weakness with myotome testing, * = concordant pain with testing)  LE ROM:   ROM Right 08/23/2021 Left 08/23/2021  Hip flexion 90* 90*  Hip extension Limited* Limited*  Hip abduction      Hip adduction      Hip internal rotation N* N*  Hip external rotation N* N*  Knee flexion 90* N  Knee extension N N  Ankle dorsiflexion      Ankle plantarflexion      Ankle inversion      Ankle eversion        (Blank rows = not tested, N = WNL, * = concordant pain with testing)   Functional Tests   Eval (08/23/2021)      Progressive balance screen (highest level completed for >/= 10''):   SLS: R 3'', L 5''                                                                                                              SPECIAL TESTS:           SLR (-)           Fadir and fabir (+) for back pain > hip pain   PATIENT SURVEYS:  FOTO 54 -> 70     TODAY'S TREATMENT:  [facility] Adult PT Treatment:                                                DATE: 09/19/21 Therapeutic Exercise: Nustep(declined) SKTC L 30s x2 Alternating GTB clamshells - 2x10 ea Hip adduction ball squeeze - 5''  2x10  Manual therapy: Skilled palpation to identify trigger points for TDN TPR L piriformis   Trigger Point Dry-Needling  Treatment instructions: Expect mild to moderate muscle soreness. S/S of pneumothorax if dry needled over a lung field, and to seek immediate medical attention should they occur. Patient verbalized understanding of these instructions and education.  Patient Consent Given: Yes Education handout provided: No Muscles treated: L piriformis  Electrical stimulation performed: No Parameters: N/A Treatment response/outcome: pain relief  [facility] Adult PT Treatment:                                                DATE: 09/14/21 Therapeutic Exercise: Nustep(declined) SKTC L 30s x2 L heel slides from slide board 15x L clamshells YTB 15x Standing PF facing wall 15x Standing marching against wall 15/15 alternating Standing DF against wall 15x Sidestepping at countertop 3 trips Manual Therapy: L piriformis release in R sidelie   [facility] Adult PT Treatment:                                                DATE: 09/12/2021 Therapeutic Exercise: Seated  hamstring curl machine with 25#, 2x10 with 5-sec eccentric return Seated active hamstring stretch x63mn on Rt 4-inch lateral heel taps 2x10 BIL Forward lunges on each side of BOSU ball with UE support as needed 2x10 BIL each side Manual Therapy: Skilled palpation to identify trigger points prior to TPDN Effleurage to muscles treated with TPDN Neuromuscular re-ed: N/A Therapeutic Activity: N/A Modalities: N/A Self Care: N/A  Trigger Point Dry-Needling  Treatment instructions: Expect mild to moderate muscle soreness. S/S of pneumothorax if dry needled over a lung field, and to seek immediate medical attention should they occur. Patient verbalized understanding of these instructions and education.  Patient Consent Given: Yes Education handout provided: No Muscles treated: Rt semimembranosus/ semitendinosus and Rt rectus  femorus Electrical stimulation performed: No Parameters: N/A Treatment response/outcome: Twitch response/ improved muscle extensibility   [facility] Adult PT Treatment:                                                DATE: 09/06/2021 Therapeutic Exercise: Sidelying IT band stretch x237m BIL Standing kickstand stance Pallof press x10 with 5-sec hold BIL in hip ER and hip IR Standing hip extension with 7# cable to ankle attachment 2x10 BIL Standing hip abduction with 7# cable to ankle attachment 2x10 BIL Standing hip flexion and subsequent knee extension with 7# cable to ankle attachment x10 BIL Manual Therapy: Supine lateral hip distraction with belt and gentle oscillation x4 min BIL Neuromuscular re-ed: N/A Therapeutic Activity: N/A Modalities: N/A Self Care: N/A      PATIENT EDUCATION:  POC, diagnosis, prognosis, HEP, and outcome measures.  Pt educated via explanation, demonstration, and handout (HEP).  Pt confirms understanding verbally.    HOME EXERCISE PROGRAM: Access Code: [email or web address]: [email or web address] Date: 08/22/2021 Prepared by: KaShearon Balo Exercises - Seated Piriformis Stretch with Trunk Bend  - 1 x daily - 7 x weekly - 3 sets - 10 reps - Hooklying Isometric Clamshell  - 1 x daily - 7 x weekly - 3 sets - 10 reps - Supine Hip Adduction Isometric with Ball  - 1 x daily - 7 x weekly - 2 sets - 10 reps - 10'' hold  Added 09/06/2021 - Sidelying ITB Stretch off Table  - 1 x daily - 7 x weekly - 2-min hold   ASTERISK SIGNS     Asterisk Signs Eval (08/23/2021)            L hip pain 5/10 average, max 7/10            L SLS balance 5''            L hip abd and flexion strength 3+                                              ASSESSMENT:   CLINICAL IMPRESSION: Pt responded well to TDN with reduction in L hip pain following therapy.  Limited in CC exercises d/t R knee pain.  Will continue to progress OC exercises as tolerated.    OBJECTIVE  IMPAIRMENTS: Pain, hip ROM, hip and LE strength, balance, gait   ACTIVITY LIMITATIONS: bending, squatting, walking, lifting   PERSONAL FACTORS: See medical history and pertinent history  GOALS:     SHORT TERM GOALS: Target date: 09/13/2021   Indiyah will be >75% HEP compliant to improve carryover between sessions and facilitate independent management of condition   Evaluation (08/23/2021): ongoing Goal status: INITIAL     LONG TERM GOALS: Target date: 10/18/2021   Terril will improve FOTO score to 70 as a proxy for functional improvement   Evaluation/Baseline (08/23/2021): 54 Goal status: INITIAL     2.  Alverda will self report >/= 50% decrease in pain from evaluation    Evaluation/Baseline (08/23/2021): 7/10 max, 5/10 average pain Goal status: INITIAL     3.  Safire will be able to stand for >30'' in L SLS stance, to show a significant improvement in balance in order to reduce fall risk    Evaluation/Baseline (08/23/2021): 30'' Goal status: INITIAL     4.  Ottis will improve the following MMTs to >/= 4/5 to show improvement in strength:  L hip flexion and abd    Evaluation/Baseline (08/23/2021): see chart in note Goal status: INITIAL     PLAN: PT FREQUENCY: 1-2x/week   PT DURATION: 8 weeks (Ending 10/18/2021)   PLANNED INTERVENTIONS: Therapeutic exercises, Aquatic therapy, Therapeutic activity, Neuro Muscular re-education, Gait training, Patient/Family education, Joint mobilization, Dry Needling, Electrical stimulation, Spinal mobilization and/or manipulation, Moist heat, Taping, Vasopneumatic device, Ionotophoresis '4mg'$ /ml Dexamethasone, and Manual therapy   PLAN FOR NEXT SESSION: TDN L piriformis, progressive hip strengthening, balance and gait, manual as needed    Kevan Ny Ennis Heavner PT 09/19/21 11:29 AM

## 2021-09-20 ENCOUNTER — Encounter: Payer: Self-pay | Admitting: Family Medicine

## 2021-09-20 ENCOUNTER — Ambulatory Visit (INDEPENDENT_AMBULATORY_CARE_PROVIDER_SITE_OTHER): Payer: PRIVATE HEALTH INSURANCE | Admitting: Family Medicine

## 2021-09-20 VITALS — BP 118/77 | HR 92 | Ht 66.0 in | Wt 148.5 lb

## 2021-09-20 DIAGNOSIS — R519 Headache, unspecified: Secondary | ICD-10-CM

## 2021-09-20 DIAGNOSIS — G43019 Migraine without aura, intractable, without status migrainosus: Secondary | ICD-10-CM | POA: Diagnosis not present

## 2021-09-20 DIAGNOSIS — G44209 Tension-type headache, unspecified, not intractable: Secondary | ICD-10-CM | POA: Diagnosis not present

## 2021-09-21 ENCOUNTER — Ambulatory Visit: Payer: PRIVATE HEALTH INSURANCE | Admitting: Physical Therapy

## 2021-09-21 ENCOUNTER — Encounter: Payer: Self-pay | Admitting: Physical Therapy

## 2021-09-21 DIAGNOSIS — M25551 Pain in right hip: Secondary | ICD-10-CM

## 2021-09-21 DIAGNOSIS — M25552 Pain in left hip: Secondary | ICD-10-CM | POA: Diagnosis not present

## 2021-09-21 DIAGNOSIS — G8929 Other chronic pain: Secondary | ICD-10-CM

## 2021-09-21 NOTE — Therapy (Signed)
OUTPATIENT PHYSICAL THERAPY TREATMENT NOTE   Patient Name: Jody Dixon MRN: [id number] DOB:1956/02/01, 66 y.o., female Today's Date: 09/21/2021  PCP: Pieter Partridge, PA REFERRING PROVIDER: Verneita Griffes, PA-C  END OF SESSION:   PT End of Session - 09/21/21 1059     Visit Number 6    Date for PT Re-Evaluation 10/18/21    Authorization Type [employer] - FOTO    PT Start Time 1059    PT Stop Time 1125    PT Time Calculation (min) 26 min    Activity Tolerance Patient tolerated treatment well    Behavior During Therapy WFL for tasks assessed/performed               Past Medical History:  Diagnosis Date   Chronic constipation    Common migraine with intractable migraine 10/21/2016   Migraine    Osteoporosis    Past Surgical History:  Procedure Laterality Date   FRACTURE SURGERY Right    foot, crushing injury   Patient Active Problem List   Diagnosis Date Noted   Common migraine with intractable migraine 10/21/2016    REFERRING DIAG: Pain in left hip [M25.552], Other chronic pain [G89.29]  THERAPY DIAG:  Pain in left hip  Pain in right hip  Chronic pain of right knee  Rationale for Evaluation and Treatment Rehabilitation  PERTINENT HISTORY: Osteoporosis, R menisectomy in October, low back pain  PRECAUTIONS: osteoporosis  SUBJECTIVE:   Pt reports improvement in her L hip pain following TDN  Pain:  Are you having pain? Yes Pain location: 3/10 BIL Rt>L, 8/10 Rt knee Aggravating factors: stairs, sleeping on L side           NPRS, highest: 7/10 Relieving factors: no relieving factors           NPRS: best: 2/10 Pain description: intermittent, aching, and throbbing Stage: Chronic Stability: getting worse 24 hour pattern: worst at night    OBJECTIVE: (objective measures completed at initial evaluation unless otherwise dated)   DIAGNOSTIC FINDINGS:  None current in chart             GENERAL OBSERVATION/GAIT:                     R hip drop in L  stance   SENSATION:          Light touch: Appears intact   PALPATION: TTP bil GT and L piriformis   MUSCLE LENGTH: Hamstrings: Right moderate restriction; Left moderate restriction with back pain   LE MMT:   MMT Right 08/23/2021 Left 08/23/2021  Hip flexion (L2, L3) 3+ 3+  Knee extension (L3) 5 5  Knee flexion 4 4  Hip abduction 4 3+  Hip extension Unable d/t back pain Unable d/t back pain  Hip external rotation      Hip internal rotation      Hip adduction      Ankle dorsiflexion (L4)      Ankle plantarflexion (S1)      Ankle inversion      Ankle eversion      Great Toe ext (L5)      Grossly        (Blank rows = not tested, score listed is out of 5 possible points.  N = WNL, D = diminished, C = clear for gross weakness with myotome testing, * = concordant pain with testing)   LE ROM:   ROM Right 08/23/2021 Left 08/23/2021  Hip flexion 90* 90*  Hip extension Limited* Limited*  Hip abduction      Hip adduction      Hip internal rotation N* N*  Hip external rotation N* N*  Knee flexion 90* N  Knee extension N N  Ankle dorsiflexion      Ankle plantarflexion      Ankle inversion      Ankle eversion        (Blank rows = not tested, N = WNL, * = concordant pain with testing)   Functional Tests   Eval (08/23/2021)      Progressive balance screen (highest level completed for >/= 10''):   SLS: R 3'', L 5''                                                                                                              SPECIAL TESTS:           SLR (-)           Fadir and fabir (+) for back pain > hip pain   PATIENT SURVEYS:  FOTO 54 -> 70     TODAY'S TREATMENT:  [facility] Adult PT Treatment:                                                DATE: 09/19/21 Therapeutic Exercise: Nustep L1 32mSKTC L 45s x2 Alternating Blue TB clamshells - x10 ea Hip adduction ball squeeze - 5'' 2x10 (NT)  Manual therapy: Skilled palpation to identify trigger points for  TDN TPR R piriformis   Trigger Point Dry-Needling  Treatment instructions: Expect mild to moderate muscle soreness. S/S of pneumothorax if dry needled over a lung field, and to seek immediate medical attention should they occur. Patient verbalized understanding of these instructions and education.  Patient Consent Given: Yes Education handout provided: No Muscles treated: R piriformis  Electrical stimulation performed: No Parameters: N/A Treatment response/outcome: pain relief  [facility] Adult PT Treatment:                                                DATE: 09/19/21 Therapeutic Exercise: Nustep(declined) SKTC L 30s x2 Alternating GTB clamshells - 2x10 ea Hip adduction ball squeeze - 5'' 2x10  Manual therapy: Skilled palpation to identify trigger points for TDN TPR L piriformis   Trigger Point Dry-Needling  Treatment instructions: Expect mild to moderate muscle soreness. S/S of pneumothorax if dry needled over a lung field, and to seek immediate medical attention should they occur. Patient verbalized understanding of these instructions and education.  Patient Consent Given: Yes Education handout provided: No Muscles treated: L piriformis  Electrical stimulation performed: No Parameters: N/A Treatment response/outcome: pain relief  [facility] Adult PT Treatment:  DATE: 09/14/21 Therapeutic Exercise: Nustep(declined) SKTC L 30s x2 L heel slides from slide board 15x L clamshells YTB 15x Standing PF facing wall 15x Standing marching against wall 15/15 alternating Standing DF against wall 15x Sidestepping at countertop 3 trips Manual Therapy: L piriformis release in R sidelie   [facility] Adult PT Treatment:                                                DATE: 09/12/2021 Therapeutic Exercise: Seated hamstring curl machine with 25#, 2x10 with 5-sec eccentric return Seated active hamstring stretch x64mn on Rt 4-inch lateral heel taps 2x10  BIL Forward lunges on each side of BOSU ball with UE support as needed 2x10 BIL each side Manual Therapy: Skilled palpation to identify trigger points prior to TPDN Effleurage to muscles treated with TPDN Neuromuscular re-ed: N/A Therapeutic Activity: N/A Modalities: N/A Self Care: N/A  Trigger Point Dry-Needling  Treatment instructions: Expect mild to moderate muscle soreness. S/S of pneumothorax if dry needled over a lung field, and to seek immediate medical attention should they occur. Patient verbalized understanding of these instructions and education.  Patient Consent Given: Yes Education handout provided: No Muscles treated: Rt semimembranosus/ semitendinosus and Rt rectus femorus Electrical stimulation performed: No Parameters: N/A Treatment response/outcome: Twitch response/ improved muscle extensibility   [facility] Adult PT Treatment:                                                DATE: 09/06/2021 Therapeutic Exercise: Sidelying IT band stretch x27m BIL Standing kickstand stance Pallof press x10 with 5-sec hold BIL in hip ER and hip IR Standing hip extension with 7# cable to ankle attachment 2x10 BIL Standing hip abduction with 7# cable to ankle attachment 2x10 BIL Standing hip flexion and subsequent knee extension with 7# cable to ankle attachment x10 BIL Manual Therapy: Supine lateral hip distraction with belt and gentle oscillation x4 min BIL Neuromuscular re-ed: N/A Therapeutic Activity: N/A Modalities: N/A Self Care: N/A      PATIENT EDUCATION:  POC, diagnosis, prognosis, HEP, and outcome measures.  Pt educated via explanation, demonstration, and handout (HEP).  Pt confirms understanding verbally.    HOME EXERCISE PROGRAM: Access Code: [email or web address]: [email or web address] Date: 08/22/2021 Prepared by: KaShearon Balo Exercises - Seated Piriformis Stretch with Trunk Bend  - 1 x daily - 7 x weekly - 3 sets - 10 reps - Hooklying Isometric  Clamshell  - 1 x daily - 7 x weekly - 3 sets - 10 reps - Supine Hip Adduction Isometric with Ball  - 1 x daily - 7 x weekly - 2 sets - 10 reps - 10'' hold  Added 09/06/2021 - Sidelying ITB Stretch off Table  - 1 x daily - 7 x weekly - 2-min hold   ASTERISK SIGNS     Asterisk Signs Eval (08/23/2021)            L hip pain 5/10 average, max 7/10            L SLS balance 5''            L hip abd and flexion strength 3+  ASSESSMENT:   CLINICAL IMPRESSION: Josalynn tolerated session well and seems to be responding well to TDN and manual therapy for her hip pain.  TDN and manual performed on R piriformis today and will monitor response.  Pt arrived late and visit was truncated.    OBJECTIVE IMPAIRMENTS: Pain, hip ROM, hip and LE strength, balance, gait   ACTIVITY LIMITATIONS: bending, squatting, walking, lifting   PERSONAL FACTORS: See medical history and pertinent history       GOALS:     SHORT TERM GOALS: Target date: 09/13/2021   Lorelei will be >75% HEP compliant to improve carryover between sessions and facilitate independent management of condition   Evaluation (08/23/2021): ongoing Goal status: MET 7/14     LONG TERM GOALS: Target date: 10/18/2021   Akshita will improve FOTO score to 70 as a proxy for functional improvement   Evaluation/Baseline (08/23/2021): 54 Goal status: INITIAL     2.  Kataleena will self report >/= 50% decrease in pain from evaluation    Evaluation/Baseline (08/23/2021): 7/10 max, 5/10 average pain Goal status: INITIAL     3.  Blanche will be able to stand for >30'' in L SLS stance, to show a significant improvement in balance in order to reduce fall risk    Evaluation/Baseline (08/23/2021): 30'' Goal status: INITIAL     4.  Lola will improve the following MMTs to >/= 4/5 to show improvement in strength:  L hip flexion and abd    Evaluation/Baseline (08/23/2021): see chart in note Goal status: INITIAL      PLAN: PT FREQUENCY: 1-2x/week   PT DURATION: 8 weeks (Ending 10/18/2021)   PLANNED INTERVENTIONS: Therapeutic exercises, Aquatic therapy, Therapeutic activity, Neuro Muscular re-education, Gait training, Patient/Family education, Joint mobilization, Dry Needling, Electrical stimulation, Spinal mobilization and/or manipulation, Moist heat, Taping, Vasopneumatic device, Ionotophoresis 78m/ml Dexamethasone, and Manual therapy   PLAN FOR NEXT SESSION: TDN L piriformis, progressive hip strengthening, balance and gait, manual as needed    KKevan NyReinhartsen PT 09/21/21 11:26 AM

## 2021-09-25 ENCOUNTER — Encounter: Payer: Self-pay | Admitting: Physical Therapy

## 2021-09-25 ENCOUNTER — Ambulatory Visit: Payer: PRIVATE HEALTH INSURANCE | Admitting: Physical Therapy

## 2021-09-25 DIAGNOSIS — M25551 Pain in right hip: Secondary | ICD-10-CM

## 2021-09-25 DIAGNOSIS — G8929 Other chronic pain: Secondary | ICD-10-CM

## 2021-09-25 DIAGNOSIS — M25552 Pain in left hip: Secondary | ICD-10-CM

## 2021-09-25 DIAGNOSIS — M545 Low back pain, unspecified: Secondary | ICD-10-CM

## 2021-09-25 NOTE — Therapy (Signed)
OUTPATIENT PHYSICAL THERAPY TREATMENT NOTE   Patient Name: Jody Dixon MRN: [id number] DOB:December 15, 1955, 66 y.o., female Today's Date: 09/25/2021  PCP: Pieter Partridge, PA REFERRING PROVIDER: Verneita Griffes, PA-C  END OF SESSION:   PT End of Session - 09/25/21 1046     Visit Number 7    Date for PT Re-Evaluation 10/18/21    Authorization Type [employer] - FOTO    PT Start Time 1045    PT Stop Time 1125    PT Time Calculation (min) 40 min    Activity Tolerance Patient tolerated treatment well    Behavior During Therapy WFL for tasks assessed/performed               Past Medical History:  Diagnosis Date   Chronic constipation    Common migraine with intractable migraine 10/21/2016   Migraine    Osteoporosis    Past Surgical History:  Procedure Laterality Date   FRACTURE SURGERY Right    foot, crushing injury   Patient Active Problem List   Diagnosis Date Noted   Common migraine with intractable migraine 10/21/2016    REFERRING DIAG: Pain in left hip [M25.552], Other chronic pain [G89.29]  THERAPY DIAG:  Pain in left hip  Pain in right hip  Chronic pain of right knee  Low back pain, unspecified back pain laterality, unspecified chronicity, unspecified whether sciatica present  Rationale for Evaluation and Treatment Rehabilitation  PERTINENT HISTORY: Osteoporosis, R menisectomy in October, low back pain  PRECAUTIONS: osteoporosis  SUBJECTIVE:   Pt reports that her hips are doing well overall.  She tried to do some lunges and now her gluteal area is sore on the L > R.  She feels the TDN is helpful along with manual.  Pain:  Are you having pain? Yes Pain location: 3/10 BIL Rt>L, 8/10 Rt knee Aggravating factors: stairs, sleeping on L side           NPRS, highest: 7/10 Relieving factors: no relieving factors           NPRS: best: 2/10 Pain description: intermittent, aching, and throbbing Stage: Chronic Stability: getting worse 24 hour pattern: worst at  night    OBJECTIVE: (objective measures completed at initial evaluation unless otherwise dated)   DIAGNOSTIC FINDINGS:  None current in chart             GENERAL OBSERVATION/GAIT:                     R hip drop in L stance   SENSATION:          Light touch: Appears intact   PALPATION: TTP bil GT and L piriformis   MUSCLE LENGTH: Hamstrings: Right moderate restriction; Left moderate restriction with back pain   LE MMT:   MMT Right 08/23/2021 Left 08/23/2021  Hip flexion (L2, L3) 3+ 3+  Knee extension (L3) 5 5  Knee flexion 4 4  Hip abduction 4 3+  Hip extension Unable d/t back pain Unable d/t back pain  Hip external rotation      Hip internal rotation      Hip adduction      Ankle dorsiflexion (L4)      Ankle plantarflexion (S1)      Ankle inversion      Ankle eversion      Great Toe ext (L5)      Grossly        (Blank rows = not tested, score listed is out of  5 possible points.  N = WNL, D = diminished, C = clear for gross weakness with myotome testing, * = concordant pain with testing)   LE ROM:   ROM Right 08/23/2021 Left 08/23/2021  Hip flexion 90* 90*  Hip extension Limited* Limited*  Hip abduction      Hip adduction      Hip internal rotation N* N*  Hip external rotation N* N*  Knee flexion 90* N  Knee extension N N  Ankle dorsiflexion      Ankle plantarflexion      Ankle inversion      Ankle eversion        (Blank rows = not tested, N = WNL, * = concordant pain with testing)   Functional Tests   Eval (08/23/2021)      Progressive balance screen (highest level completed for >/= 10''):   SLS: R 3'', L 5''                                                                                                              SPECIAL TESTS:           SLR (-)           Fadir and fabir (+) for back pain > hip pain   PATIENT SURVEYS:  FOTO 54 -> 70     TODAY'S TREATMENT:  [facility] Adult PT Treatment:                                                 DATE: 09/25/21 Therapeutic Exercise: Nustep L2 67mAlternating Blue TB clamshells - 2x10 ea Hip adduction pilates ring squeeze - 2x10 SLR - 2x10 ea  Neuromuscular re-ed: SLS - 45'' bouts Tandem on foam - 45'' bouts  Manual therapy: Skilled palpation to identify trigger points for TDN TPR L piriformis   Trigger Point Dry-Needling  Treatment instructions: Expect mild to moderate muscle soreness. S/S of pneumothorax if dry needled over a lung field, and to seek immediate medical attention should they occur. Patient verbalized understanding of these instructions and education.  Patient Consent Given: Yes Education handout provided: No Muscles treated: L piriformis  Electrical stimulation performed: No Parameters: N/A Treatment response/outcome: pain relief  [facility] Adult PT Treatment:                                                DATE: 09/19/21 Therapeutic Exercise: Nustep L1 513mKTC L 45s x2 Alternating Blue TB clamshells - x10 ea Hip adduction ball squeeze - 5'' 2x10 (NT)  Manual therapy: Skilled palpation to identify trigger points for TDN TPR R piriformis   Trigger Point Dry-Needling  Treatment instructions: Expect mild to moderate muscle soreness. S/S of pneumothorax if dry needled over a lung field,  and to seek immediate medical attention should they occur. Patient verbalized understanding of these instructions and education.  Patient Consent Given: Yes Education handout provided: No Muscles treated: R piriformis  Electrical stimulation performed: No Parameters: N/A Treatment response/outcome: pain relief  [facility] Adult PT Treatment:                                                DATE: 09/19/21 Therapeutic Exercise: Nustep(declined) SKTC L 30s x2 Alternating GTB clamshells - 2x10 ea Hip adduction ball squeeze - 5'' 2x10  Manual therapy: Skilled palpation to identify trigger points for TDN TPR L piriformis   Trigger Point Dry-Needling  Treatment instructions: Expect  mild to moderate muscle soreness. S/S of pneumothorax if dry needled over a lung field, and to seek immediate medical attention should they occur. Patient verbalized understanding of these instructions and education.  Patient Consent Given: Yes Education handout provided: No Muscles treated: L piriformis  Electrical stimulation performed: No Parameters: N/A Treatment response/outcome: pain relief  [facility] Adult PT Treatment:                                                DATE: 09/14/21 Therapeutic Exercise: Nustep(declined) SKTC L 30s x2 L heel slides from slide board 15x L clamshells YTB 15x Standing PF facing wall 15x Standing marching against wall 15/15 alternating Standing DF against wall 15x Sidestepping at countertop 3 trips Manual Therapy: L piriformis release in R sidelie   [facility] Adult PT Treatment:                                                DATE: 09/12/2021 Therapeutic Exercise: Seated hamstring curl machine with 25#, 2x10 with 5-sec eccentric return Seated active hamstring stretch x87mn on Rt 4-inch lateral heel taps 2x10 BIL Forward lunges on each side of BOSU ball with UE support as needed 2x10 BIL each side Manual Therapy: Skilled palpation to identify trigger points prior to TPDN Effleurage to muscles treated with TPDN Neuromuscular re-ed: N/A Therapeutic Activity: N/A Modalities: N/A Self Care: N/A  Trigger Point Dry-Needling  Treatment instructions: Expect mild to moderate muscle soreness. S/S of pneumothorax if dry needled over a lung field, and to seek immediate medical attention should they occur. Patient verbalized understanding of these instructions and education.  Patient Consent Given: Yes Education handout provided: No Muscles treated: Rt semimembranosus/ semitendinosus and Rt rectus femorus Electrical stimulation performed: No Parameters: N/A Treatment response/outcome: Twitch response/ improved muscle extensibility   [facility] Adult PT Treatment:                                                 DATE: 09/06/2021 Therapeutic Exercise: Sidelying IT band stretch x226m BIL Standing kickstand stance Pallof press x10 with 5-sec hold BIL in hip ER and hip IR Standing hip extension with 7# cable to ankle attachment 2x10 BIL Standing hip abduction with 7# cable to ankle attachment 2x10 BIL Standing hip flexion and subsequent  knee extension with 7# cable to ankle attachment x10 BIL Manual Therapy: Supine lateral hip distraction with belt and gentle oscillation x4 min BIL Neuromuscular re-ed: N/A Therapeutic Activity: N/A Modalities: N/A Self Care: N/A      PATIENT EDUCATION:  POC, diagnosis, prognosis, HEP, and outcome measures.  Pt educated via explanation, demonstration, and handout (HEP).  Pt confirms understanding verbally.    HOME EXERCISE PROGRAM: Access Code: [email or web address] URL: [email or web address] Date: 08/22/2021 Prepared by: Shearon Balo   Exercises - Seated Piriformis Stretch with Trunk Bend  - 1 x daily - 7 x weekly - 3 sets - 10 reps - Hooklying Isometric Clamshell  - 1 x daily - 7 x weekly - 3 sets - 10 reps - Supine Hip Adduction Isometric with Ball  - 1 x daily - 7 x weekly - 2 sets - 10 reps - 10'' hold  Added 09/06/2021 - Sidelying ITB Stretch off Table  - 1 x daily - 7 x weekly - 2-min hold   ASTERISK SIGNS     Asterisk Signs Eval (08/23/2021) 7/18           L hip pain 5/10 average, max 7/10 4/10 average           L SLS balance 5'' 15''           L hip abd and flexion strength 3+                                              ASSESSMENT:   CLINICAL IMPRESSION: Hailly tolerated session with no adverse effects.  She is slowly building exercise volume and intensity.  We discussed load management and avoiding lunges for now; she confirms understanding.  Will continue with strengthening + manual.    OBJECTIVE IMPAIRMENTS: Pain, hip ROM, hip and LE strength, balance, gait   ACTIVITY LIMITATIONS:  bending, squatting, walking, lifting   PERSONAL FACTORS: See medical history and pertinent history       GOALS:     SHORT TERM GOALS: Target date: 09/13/2021   Vivi will be >75% HEP compliant to improve carryover between sessions and facilitate independent management of condition   Evaluation (08/23/2021): ongoing Goal status: MET 7/14     LONG TERM GOALS: Target date: 10/18/2021   Ashlon will improve FOTO score to 70 as a proxy for functional improvement   Evaluation/Baseline (08/23/2021): 54 Goal status: INITIAL     2.  Peachie will self report >/= 50% decrease in pain from evaluation    Evaluation/Baseline (08/23/2021): 7/10 max, 5/10 average pain Goal status: INITIAL     3.  Jylian will be able to stand for >30'' in L SLS stance, to show a significant improvement in balance in order to reduce fall risk    Evaluation/Baseline (08/23/2021): 30'' Goal status: INITIAL     4.  Olie will improve the following MMTs to >/= 4/5 to show improvement in strength:  L hip flexion and abd    Evaluation/Baseline (08/23/2021): see chart in note Goal status: INITIAL     PLAN: PT FREQUENCY: 1-2x/week   PT DURATION: 8 weeks (Ending 10/18/2021)   PLANNED INTERVENTIONS: Therapeutic exercises, Aquatic therapy, Therapeutic activity, Neuro Muscular re-education, Gait training, Patient/Family education, Joint mobilization, Dry Needling, Electrical stimulation, Spinal mobilization and/or manipulation, Moist heat, Taping, Vasopneumatic device, Ionotophoresis 56m/ml Dexamethasone, and Manual therapy   PLAN FOR NEXT SESSION:  TDN L piriformis, progressive hip strengthening, balance and gait, manual as needed    Mathis Dad PT 09/25/21 11:25 AM

## 2021-09-27 ENCOUNTER — Ambulatory Visit: Payer: PRIVATE HEALTH INSURANCE | Admitting: Physical Therapy

## 2021-10-02 ENCOUNTER — Encounter: Payer: Self-pay | Admitting: Physical Therapy

## 2021-10-02 ENCOUNTER — Ambulatory Visit: Payer: PRIVATE HEALTH INSURANCE | Admitting: Physical Therapy

## 2021-10-02 DIAGNOSIS — G8929 Other chronic pain: Secondary | ICD-10-CM

## 2021-10-02 DIAGNOSIS — M25551 Pain in right hip: Secondary | ICD-10-CM

## 2021-10-02 DIAGNOSIS — M25552 Pain in left hip: Secondary | ICD-10-CM | POA: Diagnosis not present

## 2021-10-02 NOTE — Therapy (Addendum)
PHYSICAL THERAPY UNPLANNED DISCHARGE SUMMARY   Visits from Start of Care: 8  Current functional level related to goals / functional outcomes: Current status unknown   Remaining deficits: Current status unknown   Education / Equipment: Pt has not returned since visit listed below  Patient goals were not assessed. Patient is being discharged due to not returning since the last visit.  (the below note was addended to include the above D/C summary on 10/18/21)  OUTPATIENT PHYSICAL THERAPY TREATMENT NOTE   Patient Name: Jody Dixon MRN: [id number] DOB:[birth year], 66 y.o., female Today's Date: 10/02/2021  PCP: Pieter Partridge, PA REFERRING PROVIDER: Verneita Griffes, PA-C  END OF SESSION:   PT End of Session - 10/02/21 1050     Visit Number 8    Date for PT Re-Evaluation 10/18/21    Authorization Type [employer] - FOTO    PT Start Time 1050    PT Stop Time 1130    PT Time Calculation (min) 40 min    Activity Tolerance Patient tolerated treatment well    Behavior During Therapy [facility] for tasks assessed/performed               Past Medical History:  Diagnosis Date   Chronic constipation    Common migraine with intractable migraine 10/21/2016   Migraine    Osteoporosis    Past Surgical History:  Procedure Laterality Date   FRACTURE SURGERY Right    foot, crushing injury   Patient Active Problem List   Diagnosis Date Noted   Common migraine with intractable migraine 10/21/2016    REFERRING DIAG: Pain in left hip [M25.552], Other chronic pain [G89.29]  THERAPY DIAG:  Pain in left hip  Pain in right hip  Chronic pain of right knee  Rationale for Evaluation and Treatment Rehabilitation  PERTINENT HISTORY: Osteoporosis, R menisectomy in October, low back pain  PRECAUTIONS: osteoporosis  SUBJECTIVE:   Pt reports that her hips are improving.  She feels TDN is helpful.  Pain:  Are you having pain? Yes Pain location: 3/10 BIL Rt>L, 8/10 Rt knee Aggravating  factors: stairs, sleeping on L side           NPRS, highest: 7/10 Relieving factors: no relieving factors           NPRS: best: 2/10 Pain description: intermittent, aching, and throbbing Stage: Chronic Stability: getting worse 24 hour pattern: worst at night    OBJECTIVE: (objective measures completed at initial evaluation unless otherwise dated)   DIAGNOSTIC FINDINGS:  None current in chart             GENERAL OBSERVATION/GAIT:                     R hip drop in L stance   SENSATION:          Light touch: Appears intact   PALPATION: TTP bil GT and L piriformis   MUSCLE LENGTH: Hamstrings: Right moderate restriction; Left moderate restriction with back pain   LE MMT:   MMT Right 08/23/2021 Left 08/23/2021  Hip flexion (L2, L3) 3+ 3+  Knee extension (L3) 5 5  Knee flexion 4 4  Hip abduction 4 3+  Hip extension Unable d/t back pain Unable d/t back pain  Hip external rotation      Hip internal rotation      Hip adduction      Ankle dorsiflexion (L4)      Ankle plantarflexion (S1)      Ankle  inversion      Ankle eversion      Great Toe ext (L5)      Grossly        (Blank rows = not tested, score listed is out of 5 possible points.  N = WNL, D = diminished, C = clear for gross weakness with myotome testing, * = concordant pain with testing)   LE ROM:   ROM Right 08/23/2021 Left 08/23/2021  Hip flexion 90* 90*  Hip extension Limited* Limited*  Hip abduction      Hip adduction      Hip internal rotation N* N*  Hip external rotation N* N*  Knee flexion 90* N  Knee extension N N  Ankle dorsiflexion      Ankle plantarflexion      Ankle inversion      Ankle eversion        (Blank rows = not tested, N = WNL, * = concordant pain with testing)   Functional Tests   Eval (08/23/2021)      Progressive balance screen (highest level completed for >/= 10''):   SLS: R 3'', L 5''                                                                                                               SPECIAL TESTS:           SLR (-)           Fadir and fabir (+) for back pain > hip pain   PATIENT SURVEYS:  FOTO 54 -> 70     TODAY'S TREATMENT:  [facility] Adult PT Treatment:                                                DATE: 10/02/21 Therapeutic Exercise: Nustep L2 52mAlternating Blue TB clamshells - 2x10 ea Hip adduction in HL  Neuromuscular re-ed: SLS - 45'' bouts  Manual therapy: Skilled palpation to identify trigger points for TDN TPR R piriformis   Therapeutic Activity - collecting information for goals, checking progress, and reviewing with patient  Trigger Point Dry-Needling  Treatment instructions: Expect mild to moderate muscle soreness. S/S of pneumothorax if dry needled over a lung field, and to seek immediate medical attention should they occur. Patient verbalized understanding of these instructions and education.  Patient Consent Given: Yes Education handout provided: No Muscles treated: R piriformis  Electrical stimulation performed: No Parameters: N/A Treatment response/outcome: pain relief  [facility] Adult PT Treatment:                                                DATE: 09/25/21 Therapeutic Exercise: Nustep L2 526mlternating Blue TB clamshells - 2x10 ea Hip adduction pilates ring squeeze - 2x10 SLR -  2x10 ea  Neuromuscular re-ed: SLS - 45'' bouts Tandem on foam - 45'' bouts  Manual therapy: Skilled palpation to identify trigger points for TDN TPR L piriformis   Trigger Point Dry-Needling  Treatment instructions: Expect mild to moderate muscle soreness. S/S of pneumothorax if dry needled over a lung field, and to seek immediate medical attention should they occur. Patient verbalized understanding of these instructions and education.  Patient Consent Given: Yes Education handout provided: No Muscles treated: L piriformis  Electrical stimulation performed: No Parameters: N/A Treatment response/outcome: pain  relief  [facility] Adult PT Treatment:                                                DATE: 09/19/21 Therapeutic Exercise: Nustep L1 56mSKTC L 45s x2 Alternating Blue TB clamshells - x10 ea Hip adduction ball squeeze - 5'' 2x10 (NT)  Manual therapy: Skilled palpation to identify trigger points for TDN TPR R piriformis   Trigger Point Dry-Needling  Treatment instructions: Expect mild to moderate muscle soreness. S/S of pneumothorax if dry needled over a lung field, and to seek immediate medical attention should they occur. Patient verbalized understanding of these instructions and education.  Patient Consent Given: Yes Education handout provided: No Muscles treated: R piriformis  Electrical stimulation performed: No Parameters: N/A Treatment response/outcome: pain relief  [facility] Adult PT Treatment:                                                DATE: 09/19/21 Therapeutic Exercise: Nustep(declined) SKTC L 30s x2 Alternating GTB clamshells - 2x10 ea Hip adduction ball squeeze - 5'' 2x10  Manual therapy: Skilled palpation to identify trigger points for TDN TPR L piriformis   Trigger Point Dry-Needling  Treatment instructions: Expect mild to moderate muscle soreness. S/S of pneumothorax if dry needled over a lung field, and to seek immediate medical attention should they occur. Patient verbalized understanding of these instructions and education.  Patient Consent Given: Yes Education handout provided: No Muscles treated: L piriformis  Electrical stimulation performed: No Parameters: N/A Treatment response/outcome: pain relief  [facility] Adult PT Treatment:                                                DATE: 09/14/21 Therapeutic Exercise: Nustep(declined) SKTC L 30s x2 L heel slides from slide board 15x L clamshells YTB 15x Standing PF facing wall 15x Standing marching against wall 15/15 alternating Standing DF against wall 15x Sidestepping at countertop 3 trips Manual Therapy: L  piriformis release in R sidelie   [facility] Adult PT Treatment:                                                DATE: 09/12/2021 Therapeutic Exercise: Seated hamstring curl machine with 25#, 2x10 with 5-sec eccentric return Seated active hamstring stretch x190m on Rt 4-inch lateral heel taps 2x10 BIL Forward lunges on each side of BOSU ball with UE support as  needed 2x10 BIL each side Manual Therapy: Skilled palpation to identify trigger points prior to TPDN Effleurage to muscles treated with TPDN Neuromuscular re-ed: N/A Therapeutic Activity: N/A Modalities: N/A Self Care: N/A  Trigger Point Dry-Needling  Treatment instructions: Expect mild to moderate muscle soreness. S/S of pneumothorax if dry needled over a lung field, and to seek immediate medical attention should they occur. Patient verbalized understanding of these instructions and education.  Patient Consent Given: Yes Education handout provided: No Muscles treated: Rt semimembranosus/ semitendinosus and Rt rectus femorus Electrical stimulation performed: No Parameters: N/A Treatment response/outcome: Twitch response/ improved muscle extensibility   [facility] Adult PT Treatment:                                                DATE: 09/06/2021 Therapeutic Exercise: Sidelying IT band stretch x29mn BIL Standing kickstand stance Pallof press x10 with 5-sec hold BIL in hip ER and hip IR Standing hip extension with 7# cable to ankle attachment 2x10 BIL Standing hip abduction with 7# cable to ankle attachment 2x10 BIL Standing hip flexion and subsequent knee extension with 7# cable to ankle attachment x10 BIL Manual Therapy: Supine lateral hip distraction with belt and gentle oscillation x4 min BIL Neuromuscular re-ed: N/A Therapeutic Activity: N/A Modalities: N/A Self Care: N/A      PATIENT EDUCATION:  POC, diagnosis, prognosis, HEP, and outcome measures.  Pt educated via explanation, demonstration, and handout (HEP).  Pt  confirms understanding verbally.    HOME EXERCISE PROGRAM: Access Code: [email or web address]: [email or web address] Date: 08/22/2021 Prepared by: KShearon Balo  Exercises - Seated Piriformis Stretch with Trunk Bend  - 1 x daily - 7 x weekly - 3 sets - 10 reps - Hooklying Isometric Clamshell  - 1 x daily - 7 x weekly - 3 sets - 10 reps - Supine Hip Adduction Isometric with Ball  - 1 x daily - 7 x weekly - 2 sets - 10 reps - 10'' hold  Added 09/06/2021 - Sidelying ITB Stretch off Table  - 1 x daily - 7 x weekly - 2-min hold   ASTERISK SIGNS     Asterisk Signs Eval (08/23/2021) 7/18  7/25         L hip pain 5/10 average, max 7/10 4/10 average           L SLS balance 5'' 15''  24''         L hip abd and flexion strength 3+                                              ASSESSMENT:   CLINICAL IMPRESSION: DTriviahas progressed well with therapy.  Improved impairments include: reduced hip pain and improved hip strength.  Functional improvements include: improved transfers and ability to walk in community.  Progressions needed include: continued work at home with HEP and progressive hip and core strengthening.  Barriers to progress include: chronic condition, severe R knee pain.  Please see GOALS section for progress on short term and long term goals established at evaluation.  I recommend continuation of PT to allow completion of remaining goals and continued functional progression.    OBJECTIVE IMPAIRMENTS: Pain, hip ROM, hip and LE strength, balance, gait  ACTIVITY LIMITATIONS: bending, squatting, walking, lifting   PERSONAL FACTORS: See medical history and pertinent history       GOALS:     SHORT TERM GOALS: Target date: 09/13/2021   Jalexia will be >75% HEP compliant to improve carryover between sessions and facilitate independent management of condition   Evaluation (08/23/2021): ongoing Goal status: MET 7/14     LONG TERM GOALS: Target date: 10/18/2021   Kla  will improve FOTO score to 70 as a proxy for functional improvement   Evaluation/Baseline (08/23/2021): 54  7/25: 63 Goal status: partially met     2.  Elanah will self report >/= 50% decrease in pain from evaluation    Evaluation/Baseline (08/23/2021): 7/10 max, 5/10 average pain 7/10: 75% improvement Goal status: MET     3.  Keren will be able to stand for >30'' in L SLS stance, to show a significant improvement in balance in order to reduce fall risk    Evaluation/Baseline (08/23/2021): 30'' 7/22: 24'' Goal status: partially met     4.  Elbia will improve the following MMTs to >/= 4/5 to show improvement in strength:  L hip flexion and abd    Evaluation/Baseline (08/23/2021): see chart in note 7/25: L hip flexion 4/5 L hip abd 4/5 Goal status: MET     PLAN: PT FREQUENCY: 1-2x/week   PT DURATION: 8 weeks (Ending 10/18/2021)   PLANNED INTERVENTIONS: Therapeutic exercises, Aquatic therapy, Therapeutic activity, Neuro Muscular re-education, Gait training, Patient/Family education, Joint mobilization, Dry Needling, Electrical stimulation, Spinal mobilization and/or manipulation, Moist heat, Taping, Vasopneumatic device, Ionotophoresis 42m/ml Dexamethasone, and Manual therapy   PLAN FOR NEXT SESSION: TDN L piriformis, progressive hip strengthening, balance and gait, manual as needed    KKevan NyReinhartsen PT 10/02/21 11:30 AM

## 2021-10-18 ENCOUNTER — Telehealth: Payer: Self-pay | Admitting: Family Medicine

## 2021-10-18 NOTE — Telephone Encounter (Signed)
LVM and sent mychart msg informing pt of r/s needed for 1/22 appt- NP out.

## 2021-11-13 ENCOUNTER — Other Ambulatory Visit: Payer: Self-pay

## 2021-11-13 MED ORDER — CYCLOBENZAPRINE HCL 10 MG PO TABS: 5.0000 mg | ORAL_TABLET | Freq: Every day | ORAL | 3 refills | Status: DC

## 2022-01-28 ENCOUNTER — Other Ambulatory Visit: Payer: Self-pay

## 2022-01-28 MED ORDER — RIZATRIPTAN BENZOATE 10 MG PO TABS: ORAL_TABLET | ORAL | 11 refills | Status: DC

## 2022-03-18 ENCOUNTER — Other Ambulatory Visit: Payer: Self-pay | Admitting: Adult Health

## 2022-03-19 ENCOUNTER — Other Ambulatory Visit: Payer: Self-pay

## 2022-03-19 MED ORDER — CYCLOBENZAPRINE HCL 10 MG PO TABS: 10.0000 mg | ORAL_TABLET | Freq: Every day | ORAL | 0 refills | Status: DC

## 2022-03-19 MED ORDER — EMGALITY 120 MG/ML ~~LOC~~ SOAJ: 120.0000 mL | SUBCUTANEOUS | 0 refills | Status: DC

## 2022-03-25 ENCOUNTER — Other Ambulatory Visit: Payer: Self-pay

## 2022-03-25 MED ORDER — CYCLOBENZAPRINE HCL 10 MG PO TABS: 5.0000 mg | ORAL_TABLET | Freq: Every day | ORAL | 3 refills | Status: DC
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
## 2022-03-28 NOTE — Patient Instructions (Signed)
Below is our plan:  We will continue Emgality and topiramate. Continue rizatriptan, ondansetron and cyclobenzaprine.   Please make sure you are staying well hydrated. I recommend 50-60 ounces daily. Well balanced diet and regular exercise encouraged. Consistent sleep schedule with 6-8 hours recommended.   Please continue follow up with care team as directed.   Follow up with me in 1 year   You may receive a survey regarding today's visit. I encourage you to leave honest feed back as I do use this information to improve patient care. Thank you for seeing me today!

## 2022-04-01 ENCOUNTER — Ambulatory Visit: Payer: PRIVATE HEALTH INSURANCE | Admitting: Family Medicine

## 2022-04-03 ENCOUNTER — Encounter: Payer: Self-pay | Admitting: Family Medicine

## 2022-04-03 ENCOUNTER — Ambulatory Visit (INDEPENDENT_AMBULATORY_CARE_PROVIDER_SITE_OTHER): Payer: PRIVATE HEALTH INSURANCE | Admitting: Family Medicine

## 2022-04-03 VITALS — BP 134/86 | HR 91 | Ht 66.0 in | Wt 149.0 lb

## 2022-04-03 DIAGNOSIS — G43019 Migraine without aura, intractable, without status migrainosus: Secondary | ICD-10-CM

## 2022-04-03 DIAGNOSIS — G44209 Tension-type headache, unspecified, not intractable: Secondary | ICD-10-CM

## 2022-04-03 MED ORDER — EMGALITY 120 MG/ML ~~LOC~~ SOAJ: 120.0000 mL | SUBCUTANEOUS | 3 refills | Status: DC

## 2022-04-18 ENCOUNTER — Other Ambulatory Visit: Payer: Self-pay | Admitting: *Deleted

## 2022-04-18 MED ORDER — EMGALITY 120 MG/ML ~~LOC~~ SOAJ: 120.0000 mL | SUBCUTANEOUS | 3 refills | Status: DC

## 2022-11-21 ENCOUNTER — Telehealth: Payer: Self-pay | Admitting: Family Medicine

## 2022-11-21 MED ORDER — EMGALITY 120 MG/ML ~~LOC~~ SOAJ: 120.0000 mL | SUBCUTANEOUS | 1 refills | Status: DC

## 2022-11-21 MED ORDER — CYCLOBENZAPRINE HCL 10 MG PO TABS: 10.0000 mg | ORAL_TABLET | Freq: Every day | ORAL | 3 refills | Status: DC

## 2022-11-21 MED ORDER — RIZATRIPTAN BENZOATE 10 MG PO TABS: ORAL_TABLET | ORAL | 3 refills | Status: DC

## 2022-11-21 MED ORDER — TOPIRAMATE 100 MG PO TABS: 200.0000 mg | ORAL_TABLET | Freq: Every day | ORAL | 1 refills | Status: DC

## 2022-11-21 NOTE — Telephone Encounter (Signed)
Pt is requesting a refill for Galcanezumab-gnlm (EMGALITY) 120 MG/ML SOAJ ,  DULoxetine (CYMBALTA) 30 MG capsule,  cyclobenzaprine (FLEXERIL) 10 MG tablet ,  topiramate (TOPAMAX) 100 MG tablet &  rizatriptan (MAXALT) 10 MG tablet.  Pharmacy: [facility] [phone]

## 2022-11-21 NOTE — Telephone Encounter (Signed)
Last seen on 04/03/22 per note " She will continue Emgality every 30 days and topiramate 200mg  daily. She will continue duloxetine as prescribed by rheumatology. She may continue rizatriptan and cyclobenzaprine and ondansetron as needed.   Follow up 04/07/23  Rx is prescribed by Rheumatology

## 2022-11-21 NOTE — Telephone Encounter (Signed)
Rx sent 

## 2022-12-05 NOTE — Telephone Encounter (Signed)
At 3:54 this afternoon pt left a vm stating she has been told that a PA is needed on her [facility] , this is for pharmacy [facility] [phone]

## 2022-12-09 ENCOUNTER — Other Ambulatory Visit (HOSPITAL_COMMUNITY): Payer: Self-pay

## 2022-12-09 ENCOUNTER — Telehealth: Payer: Self-pay

## 2022-12-09 NOTE — Telephone Encounter (Signed)
Pharmacy Patient Advocate Encounter   Received notification from Physician's Office that prior authorization for Emgality 120MG /ML auto-injectors (migraine) is required/requested.   Insurance verification completed.   The patient is insured through [facility] .   Per test claim: PA required; PA started via CoverMyMeds. KEY [id number] . Waiting for clinical questions to populate.

## 2022-12-09 NOTE — Telephone Encounter (Signed)
PA request has been Submitted. New Encounter created for follow up. For additional info see Pharmacy Prior Auth telephone encounter from 12/09/2022.

## 2022-12-13 NOTE — Telephone Encounter (Signed)
Clinical questions have been submitted-awaiting determination. 

## 2022-12-19 ENCOUNTER — Other Ambulatory Visit (HOSPITAL_COMMUNITY): Payer: Self-pay

## 2022-12-19 NOTE — Telephone Encounter (Signed)
Phone room: please call pt and let her know PA Emgality approved and should now be able to pick up from pharmacy

## 2022-12-19 NOTE — Telephone Encounter (Signed)
Pharmacy Patient Advocate Encounter  Received notification from [facility] that Prior Authorization for Emgality 120MG /ML auto-injectors (migraine) has been APPROVED from 12/13/2022 to 12/13/2023. Ran test claim, Copay is $135.00 per 90DS/3ML, or $45.00 per 30DS/1ML. This test claim was processed through [facility]- copay amounts may vary at other pharmacies due to pharmacy/plan contracts, or as the patient moves through the different stages of their insurance plan.   PA #/Case ID/Reference #: N/A-was not provided per [facility] or faxed approval letter

## 2023-03-25 ENCOUNTER — Other Ambulatory Visit: Payer: Self-pay

## 2023-03-25 MED ORDER — CYCLOBENZAPRINE HCL 10 MG PO TABS: 10.0000 mg | ORAL_TABLET | Freq: Every day | ORAL | 0 refills | Status: DC
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
## 2023-04-03 NOTE — Patient Instructions (Incomplete)

## 2023-04-07 ENCOUNTER — Ambulatory Visit: Payer: PRIVATE HEALTH INSURANCE | Admitting: Family Medicine

## 2023-04-07 DIAGNOSIS — G43019 Migraine without aura, intractable, without status migrainosus: Secondary | ICD-10-CM

## 2023-04-21 ENCOUNTER — Other Ambulatory Visit: Payer: Self-pay

## 2023-04-21 MED ORDER — CYCLOBENZAPRINE HCL 10 MG PO TABS: 10.0000 mg | ORAL_TABLET | Freq: Every day | ORAL | 1 refills | Status: DC

## 2023-04-21 MED ORDER — TOPIRAMATE 100 MG PO TABS: 200.0000 mg | ORAL_TABLET | Freq: Every day | ORAL | 1 refills | Status: DC

## 2023-04-23 ENCOUNTER — Other Ambulatory Visit: Payer: Self-pay

## 2023-04-23 MED ORDER — EMGALITY 120 MG/ML ~~LOC~~ SOAJ: 120.0000 mL | SUBCUTANEOUS | 1 refills | Status: DC

## 2023-06-17 ENCOUNTER — Other Ambulatory Visit: Payer: Self-pay | Admitting: *Deleted

## 2023-06-17 MED ORDER — CYCLOBENZAPRINE HCL 10 MG PO TABS
10.0000 mg | ORAL_TABLET | Freq: Every day | ORAL | 1 refills | Status: DC
Start: 2023-06-17 — End: 2023-07-16
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
## 2023-07-14 NOTE — Patient Instructions (Signed)
 Below is our plan:  We will continue Emgality  every 30 days. Continue cyclobenzaprine  nightly as needed. Try to avoid regular use if you can. Continue rizatriptan  and ondansetron  as needed.  Please make sure you are staying well hydrated. I recommend 50-60 ounces daily. Well balanced diet and regular exercise encouraged. Consistent sleep schedule with 6-8 hours recommended.   Please continue follow up with care team as directed.   Follow up with me in 1 year   You may receive a survey regarding today's visit. I encourage you to leave honest feed back as I do use this information to improve patient care. Thank you for seeing me today!   GENERAL HEADACHE INFORMATION:   Natural supplements: Magnesium Oxide or Magnesium Glycinate 500 mg at bed (up to 800 mg daily) Coenzyme Q10 300 mg in AM Vitamin B2- 200 mg twice a day   Add 1 supplement at a time since even natural supplements can have undesirable side effects. You can sometimes buy supplements cheaper (especially Coenzyme Q10) at www.WebmailGuide.co.za or at [facility].  Migraine with aura: There is increased risk for stroke in women with migraine with aura and a contraindication for the combined contraceptive pill for use by women who have migraine with aura. The risk for women with migraine without aura is lower. However other risk factors like smoking are far more likely to increase stroke risk than migraine. There is a recommendation for no smoking and for the use of OCPs without estrogen such as progestogen only pills particularly for women with migraine with aura.Jody Dixon People who have migraine headaches with auras may be 3 times more likely to have a stroke caused by a blood clot, compared to migraine patients who don't see auras. Women who take hormone-replacement therapy may be 30 percent more likely to suffer a clot-based stroke than women not taking medication containing estrogen. Other risk factors like smoking and high blood pressure may be  much  more important.    Vitamins and herbs that show potential:   Magnesium: Magnesium (250 mg twice a day or 500 mg at bed) has a relaxant effect on smooth muscles such as blood vessels. Individuals suffering from frequent or daily headache usually have low magnesium levels which can be increase with daily supplementation of 400-750 mg. Three trials found 40-90% average headache reduction  when used as a preventative. Magnesium may help with headaches are aura, the best evidence for magnesium is for migraine with aura is its thought to stop the cortical spreading depression we believe is the pathophysiology of migraine aura.Magnesium also demonstrated the benefit in menstrually related migraine.  Magnesium is part of the messenger system in the serotonin cascade and it is a good muscle relaxant.  It is also useful for constipation which can be a side effect of other medications used to treat migraine. Good sources include nuts, whole grains, and tomatoes. Side Effects: loose stool/diarrhea  Riboflavin (vitamin B 2) 200 mg twice a day. This vitamin assists nerve cells in the production of ATP a principal energy storing molecule.  It is necessary for many chemical reactions in the body.  There have been at least 3 clinical trials of riboflavin using 400 mg per day all of which suggested that migraine frequency can be decreased.  All 3 trials showed significant improvement in over half of migraine sufferers.  The supplement is found in bread, cereal, milk, meat, and poultry.  Most Americans get more riboflavin than the recommended daily allowance, however riboflavin deficiency is not  necessary for the supplements to help prevent headache. Side effects: energizing, green urine   Coenzyme Q10: This is present in almost all cells in the body and is critical component for the conversion of energy.  Recent studies have shown that a nutritional supplement of CoQ10 can reduce the frequency of migraine attacks by improving  the energy production of cells as with riboflavin.  Doses of 150 mg twice a day have been shown to be effective.   Melatonin: Increasing evidence shows correlation between melatonin secretion and headache conditions.  Melatonin supplementation has decreased headache intensity and duration.  It is widely used as a sleep aid.  Sleep is natures way of dealing with migraine.  A dose of 3 mg is recommended to start for headaches including cluster headache. Higher doses up to 15 mg has been reviewed for use in Cluster headache and have been used. The rationale behind using melatonin for cluster is that many theories regarding the cause of Cluster headache center around the disruption of the normal circadian rhythm in the brain.  This helps restore the normal circadian rhythm.   HEADACHE DIET: Foods and beverages which may trigger migraine Note that only 20% of headache patients are food sensitive. You will know if you are food sensitive if you get a headache consistently 20 minutes to 2 hours after eating a certain food. Only cut out a food if it causes headaches, otherwise you might remove foods you enjoy! What matters most for diet is to eat a well balanced healthy diet full of vegetables and low fat protein, and to not miss meals.   Chocolate, other sweets ALL cheeses except cottage and cream cheese Dairy products, yogurt, sour cream, ice cream Liver Meat extracts (Bovril, Marmite, meat tenderizers) Meats or fish which have undergone aging, fermenting, pickling or smoking. These include: Hotdogs,salami,Lox,sausage, mortadellas,smoked salmon, pepperoni, Pickled herring Pods of broad bean (English beans, Chinese pea pods, [place] (fava) beans, lima and navy beans Ripe avocado, ripe banana Yeast extracts or active yeast preparations such as Brewer's or Fleishman's (commercial bakes goods are permitted) Tomato based foods, pizza (lasagna, etc.)   MSG (monosodium glutamate) is disguised as many things;  look for these common aliases: Monopotassium glutamate Autolysed yeast Hydrolysed protein Sodium caseinate "flavorings" "all natural preservatives" Nutrasweet   Avoid all other foods that convincingly provoke headaches.   Resources: The Dizzy Althia Jetty Your Headache Diet, migrainestrong.com  https://zamora-andrews.com/   Caffeine and Migraine For patients that have migraine, caffeine intake more than 3 days per week can lead to dependency and increased migraine frequency. I would recommend cutting back on your caffeine intake as best you can. The recommended amount of caffeine is 200-300 mg daily, although migraine patients may experience dependency at even lower doses. While you may notice an increase in headache temporarily, cutting back will be helpful for headaches in the long run. For more information on caffeine and migraine, visit: https://americanmigrainefoundation.org/resource-library/caffeine-and-migraine/   Headache Prevention Strategies:   1. Maintain a headache diary; learn to identify and avoid triggers.  - This can be a simple note where you log when you had a headache, associated symptoms, and medications used - There are several smartphone apps developed to help track migraines: Migraine Buddy, Migraine Monitor, Curelator N1-Headache App   Common triggers include: Emotional triggers: Emotional/Upset family or friends Emotional/Upset occupation Business reversal/success Anticipation anxiety Crisis-serious Post-crisis periodNew job/position   Physical triggers: Vacation Day Weekend Strenuous Exercise High Altitude Location New Move Menstrual Day Physical Illness Oversleep/Not enough sleep  Weather changes Light: Photophobia or light sesnitivity treatment involves a balance between desensitization and reduction in overly strong input. Use dark polarized glasses outside, but not inside. Avoid bright or fluorescent  light, but do not dim environment to the point that going into a normally lit room hurts. Consider FL-41 tint lenses, which reduce the most irritating wavelengths without blocking too much light.  These can be obtained at axonoptics.com or theraspecs.com Foods: see list above.   2. Limit use of acute treatments (over-the-counter medications, triptans, etc.) to no more than 2 days per week or 10 days per month to prevent medication overuse headache (rebound headache).     3. Follow a regular schedule (including weekends and holidays): Don't skip meals. Eat a balanced diet. 8 hours of sleep nightly. Minimize stress. Exercise 30 minutes per day. Being overweight is associated with a 5 times increased risk of chronic migraine. Keep well hydrated and drink 6-8 glasses of water per day.   4. Initiate non-pharmacologic measures at the earliest onset of your headache. Rest and quiet environment. Relax and reduce stress. Breathe2Relax is a free app that can instruct you on    some simple relaxtion and breathing techniques. Http://Dawnbuse.com is a    free website that provides teaching videos on relaxation.  Also, there are  many apps that   can be downloaded for "mindful" relaxation.  An app called YOGA NIDRA will help walk you through mindfulness. Another app called Calm can be downloaded to give you a structured mindfulness guide with daily reminders and skill development. Headspace for guided meditation Mindfulness Based Stress Reduction Online Course: www.palousemindfulness.com Cold compresses.   5. Don't wait!! Take the maximum allowable dosage of prescribed medication at the first sign of migraine.   6. Compliance:  Take prescribed medication regularly as directed and at the first sign of a migraine.   7. Communicate:  Call your physician when problems arise, especially if your headaches change, increase in frequency/severity, or become associated with neurological symptoms (weakness, numbness,  slurred speech, etc.). Proceed to emergency room if you experience new or worsening symptoms or symptoms do not resolve, if you have new neurologic symptoms or if headache is severe, or for any concerning symptom.   8. Headache/pain management therapies: Consider various complementary methods, including medication, behavioral therapy, psychological counselling, biofeedback, massage therapy, acupuncture, dry needling, and other modalities.  Such measures may reduce the need for medications. Counseling for pain management, where patients learn to function and ignore/minimize their pain, seems to work very well.   9. Recommend changing family's attention and focus away from patient's headaches. Instead, emphasize daily activities. If first question of day is 'How are your headaches/Do you have a headache today?', then patient will constantly think about headaches, thus making them worse. Goal is to re-direct attention away from headaches, toward daily activities and other distractions.   10. Helpful Websites: www.AmericanHeadacheSociety.org PatentHood.ch www.headaches.org TightMarket.nl www.achenet.org

## 2023-07-16 ENCOUNTER — Ambulatory Visit: Payer: PRIVATE HEALTH INSURANCE | Admitting: Family Medicine

## 2023-07-16 ENCOUNTER — Encounter: Payer: Self-pay | Admitting: Family Medicine

## 2023-07-16 VITALS — BP 133/78 | HR 84 | Ht 66.0 in | Wt 140.6 lb

## 2023-07-16 DIAGNOSIS — G43019 Migraine without aura, intractable, without status migrainosus: Secondary | ICD-10-CM

## 2023-07-16 MED ORDER — RIZATRIPTAN BENZOATE 10 MG PO TABS: ORAL_TABLET | ORAL | 11 refills | Status: AC

## 2023-07-16 MED ORDER — EMGALITY 120 MG/ML ~~LOC~~ SOAJ: 120.0000 mL | SUBCUTANEOUS | 3 refills | Status: AC

## 2023-07-16 MED ORDER — TOPIRAMATE 100 MG PO TABS: 200.0000 mg | ORAL_TABLET | Freq: Every day | ORAL | 3 refills | Status: AC

## 2023-07-16 MED ORDER — CYCLOBENZAPRINE HCL 10 MG PO TABS: 10.0000 mg | ORAL_TABLET | Freq: Every day | ORAL | 5 refills | Status: DC

## 2023-09-16 ENCOUNTER — Other Ambulatory Visit: Payer: Self-pay | Admitting: *Deleted

## 2023-09-16 MED ORDER — CYCLOBENZAPRINE HCL 10 MG PO TABS: 10.0000 mg | ORAL_TABLET | Freq: Every day | ORAL | 5 refills | Status: AC

## 2023-09-16 NOTE — Telephone Encounter (Signed)
 Last seen on 07/16/23 Follow up scheduled on 07/21/24

## 2023-12-01 ENCOUNTER — Telehealth: Payer: Self-pay | Admitting: Pharmacist

## 2023-12-01 ENCOUNTER — Other Ambulatory Visit (HOSPITAL_COMMUNITY): Payer: Self-pay

## 2023-12-01 NOTE — Telephone Encounter (Signed)
 Pharmacy Patient Advocate Encounter   Received notification from CoverMyMeds that prior authorization for Emgality  120MG /ML auto-injectors (migraine)is required/requested.   Insurance verification completed.   The patient is insured through [facility] .   Per test claim: Refill too soon. PA is not needed at this time. Medication was filled 10/16/2023. Next eligible fill date is 12/23/2023.   Per Latent:  PA not required at this time.

## 2024-02-02 ENCOUNTER — Telehealth: Payer: Self-pay | Admitting: Pharmacist

## 2024-02-02 NOTE — Telephone Encounter (Signed)
 Pharmacy Patient Advocate Encounter  Received notification from [facility] that Prior Authorization for Emgality  120MG /ML auto-injectors (migraine) has been APPROVED from 02/02/2024 to 02/01/2025   PA #/Case ID/Reference #: [id number]

## 2024-07-13 ENCOUNTER — Ambulatory Visit: Admitting: Family Medicine

## 2024-07-21 ENCOUNTER — Ambulatory Visit: Admitting: Family Medicine
